# Patient Record
Sex: Male | Born: 1945 | Race: White | Hispanic: No | Marital: Married | Smoking: Never smoker
Health system: Southern US, Community
[De-identification: ages and names within clinical notes are randomized; demographics above are authoritative.]

## PROBLEM LIST (undated history)

## (undated) DIAGNOSIS — M1990 Unspecified osteoarthritis, unspecified site: Secondary | ICD-10-CM

## (undated) DIAGNOSIS — C61 Malignant neoplasm of prostate: Secondary | ICD-10-CM

## (undated) DIAGNOSIS — I10 Essential (primary) hypertension: Secondary | ICD-10-CM

## (undated) HISTORY — DX: Malignant neoplasm of prostate: C61

## (undated) HISTORY — PX: PROSTATE BIOPSY: SHX241

---

## 2004-09-25 HISTORY — PX: CHOLECYSTECTOMY: SHX55

## 2006-09-25 HISTORY — PX: JOINT REPLACEMENT: SHX530

## 2006-12-21 ENCOUNTER — Ambulatory Visit (HOSPITAL_COMMUNITY): Admission: RE | Admit: 2006-12-21 | Discharge: 2006-12-23 | Payer: Self-pay | Admitting: Orthopaedic Surgery

## 2011-10-03 ENCOUNTER — Other Ambulatory Visit (HOSPITAL_COMMUNITY): Payer: Self-pay | Admitting: Orthopaedic Surgery

## 2011-10-04 ENCOUNTER — Encounter (HOSPITAL_COMMUNITY): Payer: Self-pay

## 2011-10-10 ENCOUNTER — Encounter (HOSPITAL_COMMUNITY): Payer: Self-pay

## 2011-10-10 ENCOUNTER — Encounter (HOSPITAL_COMMUNITY)
Admission: RE | Admit: 2011-10-10 | Discharge: 2011-10-10 | Disposition: A | Discharge status: Home / Self Care | Payer: BC Managed Care – PPO | Source: Ambulatory Visit | Attending: Orthopaedic Surgery | Admitting: Orthopaedic Surgery

## 2011-10-10 ENCOUNTER — Other Ambulatory Visit: Payer: Self-pay

## 2011-10-10 ENCOUNTER — Ambulatory Visit (HOSPITAL_COMMUNITY)
Admission: RE | Admit: 2011-10-10 | Discharge: 2011-10-10 | Disposition: A | Discharge status: Home / Self Care | Payer: BC Managed Care – PPO | Source: Ambulatory Visit | Attending: Orthopaedic Surgery | Admitting: Orthopaedic Surgery

## 2011-10-10 DIAGNOSIS — Z01810 Encounter for preprocedural cardiovascular examination: Secondary | ICD-10-CM | POA: Insufficient documentation

## 2011-10-10 DIAGNOSIS — Z01818 Encounter for other preprocedural examination: Secondary | ICD-10-CM | POA: Insufficient documentation

## 2011-10-10 DIAGNOSIS — Z01812 Encounter for preprocedural laboratory examination: Secondary | ICD-10-CM | POA: Insufficient documentation

## 2011-10-10 HISTORY — DX: Essential (primary) hypertension: I10

## 2011-10-10 HISTORY — DX: Unspecified osteoarthritis, unspecified site: M19.90

## 2011-10-10 LAB — BASIC METABOLIC PANEL
BUN: 20 mg/dL (ref 6–23)
CO2: 28 mEq/L (ref 19–32)
Calcium: 9.4 mg/dL (ref 8.4–10.5)
Chloride: 100 mEq/L (ref 96–112)
Creatinine, Ser: 1.03 mg/dL (ref 0.50–1.35)
GFR calc Af Amer: 87 mL/min — ABNORMAL LOW (ref >90)
GFR calc non Af Amer: 75 mL/min — ABNORMAL LOW (ref >90)
Glucose, Bld: 93 mg/dL (ref 70–99)
Potassium: 4.6 mEq/L (ref 3.5–5.1)
Sodium: 138 mEq/L (ref 135–145)

## 2011-10-10 LAB — SURGICAL PCR SCREEN: Staphylococcus aureus: NEGATIVE

## 2011-10-10 LAB — CBC
MCH: 27.1 pg (ref 26.0–34.0)
Platelets: 186 10*3/uL (ref 150–400)
RBC: 4.43 MIL/uL (ref 4.22–5.81)
RDW: 13.8 % (ref 11.5–15.5)
WBC: 4.5 10*3/uL (ref 4.0–10.5)

## 2011-10-10 NOTE — Patient Instructions (Signed)
20 [name]  10/10/2011   Your procedure is scheduled on:  Friday  1/18  AT 1:30 PM  Report to [facility] at 11:30 AM.  Call this number if you have problems the morning of surgery: [phone]   Remember:   Do not eat food AFTER MIDNIGHT THE NIGHT BEFORE YOUR SURGERY.  May have clear liquids FROM MIDNIGHT UNTIL  7:30 AM DAY OF YOUR SURGERY.  Clear liquids include soda, tea, black coffee, apple or grape juice, broth.         NOTHING TO DRING AFTER 7:30 AM THE DAY OF SURGERY.  Take these medicines the morning of surgery with A SIP OF WATER: NO MEDICINES TO TAKE   Do not wear jewelry, make-up or nail polish.  Do not wear lotions, powders, or perfumes. You may wear deodorant.  Do not shave 48 hours prior to surgery.  Do not bring valuables to the hospital.  Contacts, dentures or bridgework may not be worn into surgery.  Leave suitcase in the car. After surgery it may be brought to your room.  For patients admitted to the hospital, checkout time is 11:00 AM the day of discharge.   Patients discharged the day of surgery will not be allowed to drive home.  Name and phone number of your driver: WIFE  Special Instructions: CHG Shower Use Special Wash: 1/2 bottle night before surgery and 1/2 bottle morning of surgery.   Please read over the following fact sheets that you were given: MRSA Information

## 2011-10-10 NOTE — Pre-Procedure Instructions (Signed)
CXR AND EKG WERE DONE TODAY PREOP  -AT [facility] - AS PER ANESTHESIOLGIST'S GUIDELINES

## 2011-10-13 ENCOUNTER — Encounter (HOSPITAL_COMMUNITY): Payer: Self-pay | Admitting: *Deleted

## 2011-10-13 ENCOUNTER — Ambulatory Visit (HOSPITAL_COMMUNITY)
Admission: RE | Admit: 2011-10-13 | Discharge: 2011-10-13 | Disposition: A | Discharge status: Home / Self Care | Payer: BC Managed Care – PPO | Source: Ambulatory Visit | Attending: Orthopaedic Surgery | Admitting: Orthopaedic Surgery

## 2011-10-13 ENCOUNTER — Encounter (HOSPITAL_COMMUNITY)
Admission: RE | Disposition: A | Discharge status: Home / Self Care | Payer: Self-pay | Source: Ambulatory Visit | Attending: Orthopaedic Surgery

## 2011-10-13 ENCOUNTER — Encounter (HOSPITAL_COMMUNITY): Payer: Self-pay | Admitting: Anesthesiology

## 2011-10-13 ENCOUNTER — Ambulatory Visit (HOSPITAL_COMMUNITY): Payer: BC Managed Care – PPO | Admitting: Anesthesiology

## 2011-10-13 DIAGNOSIS — M7540 Impingement syndrome of unspecified shoulder: Secondary | ICD-10-CM

## 2011-10-13 DIAGNOSIS — M67919 Unspecified disorder of synovium and tendon, unspecified shoulder: Secondary | ICD-10-CM | POA: Insufficient documentation

## 2011-10-13 DIAGNOSIS — Z96649 Presence of unspecified artificial hip joint: Secondary | ICD-10-CM | POA: Insufficient documentation

## 2011-10-13 DIAGNOSIS — Z79899 Other long term (current) drug therapy: Secondary | ICD-10-CM | POA: Insufficient documentation

## 2011-10-13 DIAGNOSIS — I10 Essential (primary) hypertension: Secondary | ICD-10-CM | POA: Insufficient documentation

## 2011-10-13 DIAGNOSIS — Z7982 Long term (current) use of aspirin: Secondary | ICD-10-CM | POA: Insufficient documentation

## 2011-10-13 DIAGNOSIS — M19019 Primary osteoarthritis, unspecified shoulder: Secondary | ICD-10-CM | POA: Insufficient documentation

## 2011-10-13 DIAGNOSIS — M719 Bursopathy, unspecified: Secondary | ICD-10-CM | POA: Insufficient documentation

## 2011-10-13 DIAGNOSIS — M25819 Other specified joint disorders, unspecified shoulder: Secondary | ICD-10-CM | POA: Insufficient documentation

## 2011-10-13 SURGERY — SHOULDER ARTHROSCOPY WITH SUBACROMIAL DECOMPRESSION
Anesthesia: General | Site: Shoulder | Laterality: Right | Wound class: Clean

## 2011-10-13 MED ORDER — EPHEDRINE SULFATE 50 MG/ML IJ SOLN
INTRAMUSCULAR | Status: DC | PRN
  Administered 2011-10-13 (×3): 5 mg via INTRAVENOUS

## 2011-10-13 MED ORDER — BUPIVACAINE-EPINEPHRINE 0.25% -1:200000 IJ SOLN
INTRAMUSCULAR | Status: AC
  Filled 2011-10-13: qty 1

## 2011-10-13 MED ORDER — EPINEPHRINE HCL 1 MG/ML IJ SOLN
INTRAMUSCULAR | Status: AC
  Filled 2011-10-13: qty 2

## 2011-10-13 MED ORDER — FENTANYL CITRATE 0.05 MG/ML IJ SOLN
INTRAMUSCULAR | Status: AC
  Filled 2011-10-13: qty 2

## 2011-10-13 MED ORDER — CEFAZOLIN SODIUM-DEXTROSE 2-3 GM-% IV SOLR
INTRAVENOUS | Status: AC
  Filled 2011-10-13: qty 50

## 2011-10-13 MED ORDER — ROPIVACAINE HCL 5 MG/ML IJ SOLN
INTRAMUSCULAR | Status: AC
  Filled 2011-10-13: qty 30

## 2011-10-13 MED ORDER — CEFAZOLIN SODIUM-DEXTROSE 2-3 GM-% IV SOLR
2.0000 g | INTRAVENOUS | Status: AC
  Administered 2011-10-13: 2 g via INTRAVENOUS

## 2011-10-13 MED ORDER — SUCCINYLCHOLINE CHLORIDE 20 MG/ML IJ SOLN
INTRAMUSCULAR | Status: DC | PRN
  Administered 2011-10-13: 200 mg via INTRAVENOUS

## 2011-10-13 MED ORDER — AMLODIPINE BESYLATE 5 MG PO TABS
5.0000 mg | ORAL_TABLET | Freq: Once | ORAL | Status: AC
  Administered 2011-10-13: 5 mg via ORAL
  Filled 2011-10-13: qty 1

## 2011-10-13 MED ORDER — LACTATED RINGERS IV SOLN
INTRAVENOUS | Status: DC
  Administered 2011-10-13: 1000 mL via INTRAVENOUS

## 2011-10-13 MED ORDER — FENTANYL CITRATE 0.05 MG/ML IJ SOLN
INTRAMUSCULAR | Status: DC | PRN
  Administered 2011-10-13 (×3): 50 ug via INTRAVENOUS

## 2011-10-13 MED ORDER — DEXAMETHASONE SODIUM PHOSPHATE 10 MG/ML IJ SOLN
INTRAMUSCULAR | Status: DC | PRN
  Administered 2011-10-13: 10 mg via INTRAVENOUS

## 2011-10-13 MED ORDER — ONDANSETRON HCL 4 MG/2ML IJ SOLN
INTRAMUSCULAR | Status: DC | PRN
  Administered 2011-10-13: 4 mg via INTRAVENOUS

## 2011-10-13 MED ORDER — PROPOFOL 10 MG/ML IV EMUL
INTRAVENOUS | Status: DC | PRN
  Administered 2011-10-13: 200 mg via INTRAVENOUS

## 2011-10-13 MED ORDER — FENTANYL CITRATE 0.05 MG/ML IJ SOLN
50.0000 ug | INTRAMUSCULAR | Status: DC | PRN
  Administered 2011-10-13: 100 ug via INTRAVENOUS

## 2011-10-13 MED ORDER — PROMETHAZINE HCL 25 MG/ML IJ SOLN
INTRAMUSCULAR | Status: AC
  Administered 2011-10-13: 6.25 mg via INTRAVENOUS
  Filled 2011-10-13: qty 1

## 2011-10-13 MED ORDER — PHENYLEPHRINE HCL 10 MG/ML IJ SOLN
10.0000 mg | INTRAVENOUS | Status: DC | PRN
  Administered 2011-10-13: 20 ug/min via INTRAVENOUS

## 2011-10-13 MED ORDER — OXYCODONE-ACETAMINOPHEN 5-325 MG PO TABS: 1.0000 | ORAL_TABLET | ORAL | Status: AC | PRN

## 2011-10-13 MED ORDER — LIDOCAINE HCL (CARDIAC) 20 MG/ML IV SOLN
INTRAVENOUS | Status: DC | PRN
  Administered 2011-10-13: 100 mg via INTRAVENOUS

## 2011-10-13 MED ORDER — ROPIVACAINE HCL 5 MG/ML IJ SOLN
INTRAMUSCULAR | Status: DC | PRN
  Administered 2011-10-13: 30 mL

## 2011-10-13 MED ORDER — LACTATED RINGERS IR SOLN
Status: DC | PRN
  Administered 2011-10-13: 3000 mL

## 2011-10-13 MED ORDER — ACETAMINOPHEN 10 MG/ML IV SOLN
INTRAVENOUS | Status: AC
  Filled 2011-10-13: qty 100

## 2011-10-13 MED ORDER — MIDAZOLAM HCL 2 MG/2ML IJ SOLN
INTRAMUSCULAR | Status: AC
  Filled 2011-10-13: qty 2

## 2011-10-13 MED ORDER — MIDAZOLAM HCL 5 MG/5ML IJ SOLN
INTRAMUSCULAR | Status: DC | PRN
  Administered 2011-10-13: 2 mg via INTRAVENOUS

## 2011-10-13 MED ORDER — HYDROMORPHONE HCL PF 1 MG/ML IJ SOLN: 0.2500 mg | INTRAMUSCULAR | Status: DC | PRN

## 2011-10-13 MED ORDER — PROMETHAZINE HCL 25 MG/ML IJ SOLN
6.2500 mg | INTRAMUSCULAR | Status: DC | PRN
  Administered 2011-10-13: 6.25 mg via INTRAVENOUS

## 2011-10-13 MED ORDER — ACETAMINOPHEN 10 MG/ML IV SOLN
INTRAVENOUS | Status: DC | PRN
  Administered 2011-10-13: 1000 mg via INTRAVENOUS

## 2011-10-13 MED ORDER — EPINEPHRINE HCL 1 MG/ML IJ SOLN
INTRAMUSCULAR | Status: DC | PRN
  Administered 2011-10-13: 2 mg

## 2011-10-13 MED ORDER — MIDAZOLAM HCL 2 MG/2ML IJ SOLN
1.0000 mg | INTRAMUSCULAR | Status: DC | PRN
  Administered 2011-10-13: 2 mg via INTRAVENOUS

## 2011-10-13 SURGICAL SUPPLY — 46 items
ANCHOR PUSHLOCK PEEK 3.5X19.5 (Anchor) ×2 IMPLANT
BLADE 4.2CUDA (BLADE) ×2 IMPLANT
BLADE SURG SZ11 CARB STEEL (BLADE) ×2 IMPLANT
BOOTIES KNEE HIGH SLOAN (MISCELLANEOUS) IMPLANT
BUR VERTEX HOODED 4.5 (BURR) ×2 IMPLANT
CANNULA 5.75X7 CRYSTAL CLEAR (CANNULA) ×2 IMPLANT
CANNULA SHOULDER 7CM (CANNULA) ×2 IMPLANT
CANNULA TWIST IN 8.25X7CM (CANNULA) IMPLANT
CLOTH BEACON ORANGE TIMEOUT ST (SAFETY) ×2 IMPLANT
DRAPE INCISE IOBAN 66X45 STRL (DRAPES) ×2 IMPLANT
DRAPE SHOULDER BEACH CHAIR (DRAPES) ×2 IMPLANT
DRAPE U-SHAPE 47X51 STRL (DRAPES) ×2 IMPLANT
DRSG PAD ABDOMINAL 8X10 ST (GAUZE/BANDAGES/DRESSINGS) ×2 IMPLANT
DURAPREP 26ML APPLICATOR (WOUND CARE) ×2 IMPLANT
GAUZE XEROFORM 1X8 LF (GAUZE/BANDAGES/DRESSINGS) ×2 IMPLANT
GLOVE BIO SURGEON STRL SZ7.5 (GLOVE) ×2 IMPLANT
GLOVE BIO SURGEON STRL SZ8 (GLOVE) ×4 IMPLANT
GLOVE BIOGEL PI IND STRL 8 (GLOVE) ×1 IMPLANT
GLOVE BIOGEL PI INDICATOR 8 (GLOVE) ×1
GLOVE ECLIPSE 8.0 STRL XLNG CF (GLOVE) ×2 IMPLANT
GLOVE ORTHO TXT STRL SZ7.5 (GLOVE) ×2 IMPLANT
GOWN PREVENTION PLUS LG XLONG (DISPOSABLE) ×2 IMPLANT
GOWN PREVENTION PLUS XLARGE (GOWN DISPOSABLE) ×2 IMPLANT
GOWN STRL NON-REIN LRG LVL3 (GOWN DISPOSABLE) ×2 IMPLANT
GOWN STRL REIN XL XLG (GOWN DISPOSABLE) ×4 IMPLANT
IV LACTATED RINGER IRRG 3000ML (IV SOLUTION) ×3
IV LR IRRIG 3000ML ARTHROMATIC (IV SOLUTION) ×3 IMPLANT
MANIFOLD NEPTUNE II (INSTRUMENTS) ×2 IMPLANT
NEEDLE HYPO 25X1 1.5 SAFETY (NEEDLE) IMPLANT
NEEDLE SCORPION (NEEDLE) ×2 IMPLANT
NEEDLE SPNL 18GX3.5 QUINCKE PK (NEEDLE) ×2 IMPLANT
PACK SHOULDER CUSTOM OPM052 (CUSTOM PROCEDURE TRAY) ×2 IMPLANT
POSITIONER SURGICAL ARM (MISCELLANEOUS) ×2 IMPLANT
SET ARTHROSCOPY TUBING (MISCELLANEOUS) ×1
SET ARTHROSCOPY TUBING LN (MISCELLANEOUS) ×1 IMPLANT
SLING ARM IMMOBILIZER LRG (SOFTGOODS) IMPLANT
SLING ARM IMMOBILIZER XL (CAST SUPPLIES) ×2 IMPLANT
SPONGE GAUZE 4X4 12PLY (GAUZE/BANDAGES/DRESSINGS) ×2 IMPLANT
STAPLER VISISTAT 35W (STAPLE) IMPLANT
STRIP CLOSURE SKIN 1/2X4 (GAUZE/BANDAGES/DRESSINGS) IMPLANT
SUT ETHILON 3 0 PS 1 (SUTURE) ×2 IMPLANT
SYR CONTROL 10ML LL (SYRINGE) IMPLANT
TAPE HYPAFIX 4 X10 (GAUZE/BANDAGES/DRESSINGS) ×2 IMPLANT
TOWEL OR 17X26 10 PK STRL BLUE (TOWEL DISPOSABLE) ×2 IMPLANT
TUBING CONNECTING 10 (TUBING) ×2 IMPLANT
WAND 90 DEG TURBOVAC W/CORD (SURGICAL WAND) ×2 IMPLANT

## 2011-10-13 NOTE — Anesthesia Preprocedure Evaluation (Signed)
Anesthesia Evaluation  Patient identified by MRN, date of birth, ID band Patient awake    Reviewed: Allergy & Precautions, H&P , NPO status , Patient's Chart, lab work & pertinent test results  Airway Mallampati: II TM Distance: <3 FB Neck ROM: Full    Dental No notable dental hx.    Pulmonary neg pulmonary ROS,  clear to auscultation  Pulmonary exam normal       Cardiovascular hypertension, Regular Normal    Neuro/Psych Negative Neurological ROS  Negative Psych ROS   GI/Hepatic negative GI ROS, Neg liver ROS,   Endo/Other  Negative Endocrine ROSMorbid obesity  Renal/GU negative Renal ROS  Genitourinary negative   Musculoskeletal negative musculoskeletal ROS (+)   Abdominal   Peds negative pediatric ROS (+)  Hematology negative hematology ROS (+)   Anesthesia Other Findings   Reproductive/Obstetrics negative OB ROS                           Anesthesia Physical Anesthesia Plan  ASA: III  Anesthesia Plan:    Post-op Pain Management:    Induction: Intravenous  Airway Management Planned: Oral ETT  Additional Equipment:   Intra-op Plan:   Post-operative Plan: Extubation in OR  Informed Consent: I have reviewed the patients History and Physical, chart, labs and discussed the procedure including the risks, benefits and alternatives for the proposed anesthesia with the patient or authorized representative who has indicated his/her understanding and acceptance.   Dental advisory given  Plan Discussed with: CRNA  Anesthesia Plan Comments:         Anesthesia Quick Evaluation

## 2011-10-13 NOTE — Progress Notes (Signed)
Block postponed after sedation medication given due to case in OR room was going to take longer than expected.  Dr Dot Lanes, MD postponed block until closer to surgery time.

## 2011-10-13 NOTE — Anesthesia Procedure Notes (Signed)
Anesthesia Regional Block:  Interscalene brachial plexus block  Pre-Anesthetic Checklist: ,, timeout performed, Correct Patient, Correct Site, Correct Laterality, Correct Procedure, Correct Position, site marked, Risks and benefits discussed,  Surgical consent,  Pre-op evaluation,  At surgeon's request and post-op pain management  Laterality: Right  Prep: chloraprep       Needles:  Injection technique: Single-shot  Needle Type: Stimiplex     Needle Length:cm 9 cm Needle Gauge: 21 G    Additional Needles:  Procedures: ultrasound guided Interscalene brachial plexus block Narrative:  Start time: 10/13/2011 2:45 PM End time: 10/13/2011 3:00 PM Injection made incrementally with aspirations every 5 mL.  Performed by: Personally  Anesthesiologist: Eilene Ghazi MD  Additional Notes: Patient tolerated the procedure well without complications  Interscalene brachial plexus block

## 2011-10-13 NOTE — Progress Notes (Signed)
Notified pt of delay

## 2011-10-13 NOTE — H&P (Signed)
[name] is an 66 y.o. male.   Chief Complaint:   Severe right shoulder pain HPI:   Know right shoulder impingement and arthritis.  Failed conservative treatment.  Wishes to proceed with a shoulder scope given the failure of conservative treatment.  Past Medical History  Diagnosis Date  . Hypertension   . 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    Past Surgical History  Procedure Date  . Cholecystectomy 2006  . Joint replacement 2008    right hip arthroplasty    History reviewed. No pertinent family history. Social History:  reports that he has never smoked. He has never used smokeless tobacco. He reports that he does not drink alcohol or use illicit drugs.  Allergies: No Known Allergies  Medications Prior to Admission  Medication Dose Route Frequency Provider Last Rate Last Dose  . amLODipine (NORVASC) tablet 5 mg  5 mg Oral Once Kathryne Hitch, MD   5 mg at 10/13/11 1610  . ceFAZolin (ANCEF) IVPB 2 g/50 mL premix  2 g Intravenous 60 min Pre-Op Kathryne Hitch, MD       Medications Prior to Admission  Medication Sig Dispense Refill  . amLODipine-valsartan (EXFORGE) 5-160 MG per tablet Take 1 tablet by mouth daily before breakfast.      . aspirin EC 81 MG tablet Take 81 mg by mouth daily before breakfast.      . B Complex Vitamins (B COMPLEX PO) Take 1 capsule by mouth daily.      Marland Kitchen LYSINE PO Take 1 tablet by mouth daily before breakfast.      . Multiple Vitamin (MULITIVITAMIN WITH MINERALS) TABS Take 1 tablet by mouth daily.        No results found for this or any previous visit (from the past 48 hour(s)). No results found.  Review of Systems  All other systems reviewed and are negative.    Blood pressure 138/84, pulse 95, temperature 97.8 F (36.6 C), temperature source Oral, resp. rate 18, SpO2 97.00%. Physical Exam  Constitutional: He is oriented to person, place, and time. He appears well-developed and well-nourished.  HENT:  Head:  Normocephalic and atraumatic.  Eyes: EOM are normal. Pupils are equal, round, and reactive to light.  Neck: Normal range of motion. Neck supple.  Cardiovascular: Normal rate and regular rhythm.   Respiratory: Effort normal and breath sounds normal.  GI: Soft. Bowel sounds are normal.  Musculoskeletal:       Right shoulder: He exhibits decreased range of motion and bony tenderness.  Neurological: He is alert and oriented to person, place, and time.  Skin: Skin is warm and dry.  Psychiatric: He has a normal mood and affect.     Assessment/Plan To the OR today for a right shoulder arthroscopy  Bhavin Monjaraz Y 10/13/2011, 12:21 PM

## 2011-10-13 NOTE — Anesthesia Postprocedure Evaluation (Signed)
  Anesthesia Post-op Note  Patient: [name]  Procedure(s) Performed:  SHOULDER ARTHROSCOPY WITH SUBACROMIAL DECOMPRESSION - with Debridement  Patient Location: PACU  Anesthesia Type: GA combined with regional for post-op pain  Level of Consciousness: awake and alert   Airway and Oxygen Therapy: Patient Spontanous Breathing  Post-op Pain: mild  Post-op Assessment: Post-op Vital signs reviewed, Patient's Cardiovascular Status Stable, Respiratory Function Stable, Patent Airway and No signs of Nausea or vomiting  Post-op Vital Signs: stable  Complications: No apparent anesthesia complications

## 2011-10-13 NOTE — Brief Op Note (Signed)
10/13/2011  5:21 PM  PATIENT:  [name]  66 y.o. male  PRE-OPERATIVE DIAGNOSIS:  Right Shoulder Impingment Syndrome  POST-OPERATIVE DIAGNOSIS:  Right Shoulder Impingment Syndrome  PROCEDURE:  Procedure(s): SHOULDER ARTHROSCOPY WITH SUBACROMIAL DECOMPRESSION AND EXTENSIVE DEBRIDEMENT  SURGEON:  Surgeon(s): Kathryne Hitch, MD  PHYSICIAN ASSISTANT:   ASSISTANTS: none   ANESTHESIA:   regional and general  EBL:  Total I/O In: 1000 [I.V.:1000] Out: -   BLOOD ADMINISTERED:none  DRAINS: none   LOCAL MEDICATIONS USED:  NONE  SPECIMEN:  No Specimen  DISPOSITION OF SPECIMEN:  N/A  COUNTS:  YES  TOURNIQUET:  * No tourniquets in log *  DICTATION: .Other Dictation: Dictation Number [id number]  PLAN OF CARE: Discharge to home after PACU  PATIENT DISPOSITION:  PACU - hemodynamically stable.   Delay start of Pharmacological VTE agent (>24hrs) due to surgical blood loss or risk of bleeding:  {YES/NO/NOT APPLICABLE:20182

## 2011-10-13 NOTE — Transfer of Care (Signed)
Immediate Anesthesia Transfer of Care Note  Patient: [name]  Procedure(s) Performed:  SHOULDER ARTHROSCOPY WITH SUBACROMIAL DECOMPRESSION - with Debridement  Patient Location: PACU  Anesthesia Type: General  Level of Consciousness: sedated, patient cooperative and responds to stimulaton  Airway & Oxygen Therapy: Patient Spontanous Breathing and Patient connected to face mask oxgen  Post-op Assessment: Report given to PACU RN and Post -op Vital signs reviewed and stable  Post vital signs: Reviewed and stable  Complications: No apparent anesthesia complications

## 2011-10-14 NOTE — Op Note (Signed)
NAMEKIRON, [name] NO.:  [id number]  MEDICAL RECORD NO.:  [id number]  LOCATION:  [facility]                         FACILITY:  [facility]  PHYSICIAN:  Vanita Panda. 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  PROCEDURE:  Right shoulder arthroscopy with extensive debridement and subacromial decompression.  SURGEON:  Vanita Panda. Magnus Ivan, M.D.  ANESTHESIA: 1. Regional right shoulder block. 2. General.  BLOOD LOSS:  Minimal.  COMPLICATIONS:  None.  INDICATIONS:  [name] is a 66 year old gentleman well known to me. He has had shoulder problems for some time with his right shoulder and x- ray evidence of significant osteoarthritis of the shoulder.  He does not wish to proceed with a shoulder replacement, would like at least an arthroscopic intervention, so we could clean up the shoulder soft tissues and get him at least some decrease in pain and improvement in function.  The risks and benefits of surgery explained to him in detail, and he does wished to proceed with surgery.  PROCEDURE DESCRIPTION:  After informed consent was obtained, appropriate right shoulder was marked.  Anesthesia obtained with regional shoulder block.  Then, he was brought to the operating room and placed supine on the operating table on a bean bag.  Once general anesthesia was obtained, he was turned into the lateral decubitus position with appropriate padding around his body.  An axillary roll was placed as well, and then his right arm was prepped and draped with DuraPrep and sterile dressings placed in.  45 degrees of forward flexion and abduction with 15 pounds of traction due to his  size.  A time-out was called and he was identified as correct patient and correct right shoulder.  I then made a posterior arthroscopy portal at the posterolateral subacromion and entered the glenohumeral joint.  I found extensive arthritic changes along the humeral head and the glenoid. There were synovium that was inflamed throughout the shoulder.  The biceps tendon was intact and there was some partial tearing of the undersurface of the rotator cuff.  I made an anterior portal in the rotator interval between the subscapularis and the biceps tendon.  I found the subscapularis to be intact.  Using a soft tissue ablation wand and  arthroscopic shaver that was performed in extensive debridement. The lining of the shoulder cartilage that was frayed from the humeral head, the undersurface of the rotator cuff, and all around the labrum of the shoulder, it was quite extensive.  I then do to the posterior portal in the subacromial space, and made a separate lateral portal as well.  I found significant bursitis in the subacromial space and significant tendinosis of the rotator cuff.  I used soft tissue ablation wand and shaver to perform subacromial decompression, and cleaned the rotator cuff tendon off debride, tendinosis, and bursa.  I then used a high- speed bur to coplane underneath the clavicle and the acromion, and removed some sharp  bone off from front of the acromion with a partial acromioplasty.  I then removed all instrumentation and closed with 3 portal sites with nylon suture.  Xeroform followed by well-padded sterile dressing was applied, and the patient was placed in a sling, awakened, extubated, and taken to recovery room in stable condition. All final counts were correct.  There were no complications noted.  He will be discharged to home from the recovery room after short stay.     Vanita Panda. Magnus Ivan, M.D.     CYB/MEDQ  D:  10/13/2011  T:  10/14/2011  Job:  [id number]

## 2012-12-30 ENCOUNTER — Other Ambulatory Visit: Payer: Self-pay | Admitting: Gastroenterology

## 2013-01-09 ENCOUNTER — Encounter (HOSPITAL_COMMUNITY)
Admission: RE | Disposition: A | Discharge status: Home / Self Care | Payer: Self-pay | Source: Ambulatory Visit | Attending: Gastroenterology

## 2013-01-09 ENCOUNTER — Ambulatory Visit (HOSPITAL_COMMUNITY)
Admission: RE | Admit: 2013-01-09 | Discharge: 2013-01-09 | Disposition: A | Discharge status: Home / Self Care | Payer: Medicare Other | Source: Ambulatory Visit | Attending: Gastroenterology | Admitting: Gastroenterology

## 2013-01-09 ENCOUNTER — Encounter (HOSPITAL_COMMUNITY): Payer: Self-pay | Admitting: *Deleted

## 2013-01-09 DIAGNOSIS — Z860100 Personal history of colon polyps, unspecified: Secondary | ICD-10-CM | POA: Insufficient documentation

## 2013-01-09 DIAGNOSIS — Z1211 Encounter for screening for malignant neoplasm of colon: Secondary | ICD-10-CM | POA: Insufficient documentation

## 2013-01-09 DIAGNOSIS — I8390 Asymptomatic varicose veins of unspecified lower extremity: Secondary | ICD-10-CM | POA: Insufficient documentation

## 2013-01-09 DIAGNOSIS — K5730 Diverticulosis of large intestine without perforation or abscess without bleeding: Secondary | ICD-10-CM | POA: Insufficient documentation

## 2013-01-09 DIAGNOSIS — Z79899 Other long term (current) drug therapy: Secondary | ICD-10-CM | POA: Insufficient documentation

## 2013-01-09 DIAGNOSIS — I10 Essential (primary) hypertension: Secondary | ICD-10-CM | POA: Insufficient documentation

## 2013-01-09 HISTORY — PX: COLONOSCOPY: SHX5424

## 2013-01-09 SURGERY — COLONOSCOPY
Anesthesia: Moderate Sedation

## 2013-01-09 MED ORDER — MIDAZOLAM HCL 10 MG/2ML IJ SOLN
INTRAMUSCULAR | Status: AC
  Filled 2013-01-09: qty 4

## 2013-01-09 MED ORDER — FENTANYL CITRATE 0.05 MG/ML IJ SOLN
INTRAMUSCULAR | Status: DC | PRN
  Administered 2013-01-09 (×4): 25 ug via INTRAVENOUS

## 2013-01-09 MED ORDER — FENTANYL CITRATE 0.05 MG/ML IJ SOLN
INTRAMUSCULAR | Status: AC
  Filled 2013-01-09: qty 4

## 2013-01-09 MED ORDER — DIPHENHYDRAMINE HCL 50 MG/ML IJ SOLN
INTRAMUSCULAR | Status: AC
  Filled 2013-01-09: qty 1

## 2013-01-09 MED ORDER — MIDAZOLAM HCL 5 MG/5ML IJ SOLN
INTRAMUSCULAR | Status: DC | PRN
  Administered 2013-01-09: 2 mg via INTRAVENOUS
  Administered 2013-01-09: 1 mg via INTRAVENOUS
  Administered 2013-01-09: 2 mg via INTRAVENOUS
  Administered 2013-01-09 (×2): 2.5 mg via INTRAVENOUS

## 2013-01-09 MED ORDER — LACTATED RINGERS IV SOLN
INTRAVENOUS | Status: DC
  Administered 2013-01-09: 1000 mL via INTRAVENOUS

## 2013-01-09 MED ORDER — SODIUM CHLORIDE 0.9 % IV SOLN: INTRAVENOUS | Status: DC

## 2013-01-09 NOTE — Op Note (Signed)
Procedure: Surveillance colonoscopy  Endoscopist: Danise Edge  Premedication: Fentanyl 100 mcg. Versed 10 mg. Patient remained alert throughout the procedure despite receiving conscious sedation. I recommend using propofol with the patient's next colonoscopy.  Procedure: The patient was placed in the left lateral decubitus position. Anal inspection and digital rectal exam were normal. The Pentax pediatric colonoscope was introduced into the rectum and advanced to the cecum. A normal-appearing ileocecal valve and appendiceal orifice were identified. Colonic preparation for the exam today was fair. With vigorous colonic irrigation, the colon mucosa was adequately visualized to rule out polyps.  Rectum. Normal. Retroflex view of the distal rectum  Sigmoid colon and descending colon. Left colonic diverticulosis  Splenic flexure. Normal.  Transverse colon. Normal.  Hepatic flexure. Normal.  Ascending colon. Normal.  Cecum and ileocecal valve. Normal.  Assessment:  #1. Left colonic diverticulosis  #2. Normal screening proctocolonoscopy to the cecum. Colon preparation for the exam today was graded as been fair  Recommendations: Schedule surveillance colonoscopy in 5 years. Use propofol sedation with the next colonoscopy.

## 2013-01-09 NOTE — H&P (Signed)
  Procedure: Surveillance colonoscopy.  History: The patient is a 67 year old male born [birth year]. The patient tells me he has undergone colonoscopic exams in the past to remove colon polyps.  The patient is scheduled to undergo a surveillance colonoscopy today.  Medication allergies: None  Chronic medications: Exforge. Hydrochlorothiazide.  Past medical and surgical history: Hypertension. Lower extremity varicose veins. Laparoscopic cholecystectomy. Achilles tendon reattachment.  Habits: The patient has never smoked cigarettes.  Exam: The patient is alert and lying comfortably on the endoscopy stretcher. Lungs are clear to auscultation. Cardiac exam reveals a regular rhythm. Mouth and throat appear normal.  Plan: Proceed with surveillance colonoscopy.

## 2013-01-10 ENCOUNTER — Encounter (HOSPITAL_COMMUNITY): Payer: Self-pay | Admitting: Gastroenterology

## 2015-01-20 ENCOUNTER — Emergency Department (HOSPITAL_COMMUNITY): Payer: Commercial Managed Care - HMO

## 2015-01-20 ENCOUNTER — Encounter (HOSPITAL_COMMUNITY): Payer: Self-pay | Admitting: *Deleted

## 2015-01-20 ENCOUNTER — Inpatient Hospital Stay (HOSPITAL_COMMUNITY)
Admission: EM | Admit: 2015-01-20 | Discharge: 2015-01-22 | DRG: 872 | Disposition: A | Discharge status: Home / Self Care | Payer: Commercial Managed Care - HMO | Attending: Urology | Admitting: Urology

## 2015-01-20 DIAGNOSIS — I10 Essential (primary) hypertension: Secondary | ICD-10-CM | POA: Diagnosis present

## 2015-01-20 DIAGNOSIS — N390 Urinary tract infection, site not specified: Secondary | ICD-10-CM | POA: Diagnosis present

## 2015-01-20 DIAGNOSIS — Z9049 Acquired absence of other specified parts of digestive tract: Secondary | ICD-10-CM | POA: Diagnosis present

## 2015-01-20 DIAGNOSIS — R3915 Urgency of urination: Secondary | ICD-10-CM | POA: Diagnosis present

## 2015-01-20 DIAGNOSIS — R319 Hematuria, unspecified: Secondary | ICD-10-CM | POA: Diagnosis present

## 2015-01-20 DIAGNOSIS — Z9889 Other specified postprocedural states: Secondary | ICD-10-CM

## 2015-01-20 DIAGNOSIS — B9620 Unspecified Escherichia coli [E. coli] as the cause of diseases classified elsewhere: Secondary | ICD-10-CM | POA: Diagnosis present

## 2015-01-20 DIAGNOSIS — A419 Sepsis, unspecified organism: Principal | ICD-10-CM | POA: Diagnosis present

## 2015-01-20 DIAGNOSIS — N179 Acute kidney failure, unspecified: Secondary | ICD-10-CM | POA: Diagnosis not present

## 2015-01-20 DIAGNOSIS — R509 Fever, unspecified: Secondary | ICD-10-CM | POA: Diagnosis present

## 2015-01-20 DIAGNOSIS — M1990 Unspecified osteoarthritis, unspecified site: Secondary | ICD-10-CM | POA: Diagnosis present

## 2015-01-20 DIAGNOSIS — R972 Elevated prostate specific antigen [PSA]: Secondary | ICD-10-CM | POA: Diagnosis present

## 2015-01-20 DIAGNOSIS — Z96641 Presence of right artificial hip joint: Secondary | ICD-10-CM | POA: Diagnosis present

## 2015-01-20 LAB — CBC WITH DIFFERENTIAL/PLATELET
BASOS PCT: 0 % (ref 0–1)
Basophils Absolute: 0 10*3/uL (ref 0.0–0.1)
EOS ABS: 0 10*3/uL (ref 0.0–0.7)
Eosinophils Relative: 0 % (ref 0–5)
HCT: 37.8 % — ABNORMAL LOW (ref 39.0–52.0)
Hemoglobin: 11.9 g/dL — ABNORMAL LOW (ref 13.0–17.0)
Lymphocytes Relative: 2 % — ABNORMAL LOW (ref 12–46)
Lymphs Abs: 0.1 10*3/uL — ABNORMAL LOW (ref 0.7–4.0)
MCH: 28 pg (ref 26.0–34.0)
MCHC: 31.5 g/dL (ref 30.0–36.0)
MCV: 88.9 fL (ref 78.0–100.0)
Monocytes Absolute: 0 10*3/uL — ABNORMAL LOW (ref 0.1–1.0)
Monocytes Relative: 1 % — ABNORMAL LOW (ref 3–12)
NEUTROS ABS: 5.8 10*3/uL (ref 1.7–7.7)
NEUTROS PCT: 97 % — AB (ref 43–77)
PLATELETS: 130 10*3/uL — AB (ref 150–400)
RBC: 4.25 MIL/uL (ref 4.22–5.81)
RDW: 14.4 % (ref 11.5–15.5)
WBC: 6 10*3/uL (ref 4.0–10.5)

## 2015-01-20 LAB — URINALYSIS, ROUTINE W REFLEX MICROSCOPIC
Glucose, UA: NEGATIVE mg/dL
Ketones, ur: 15 mg/dL — AB
NITRITE: POSITIVE — AB
PH: 5.5 (ref 5.0–8.0)
Protein, ur: >300 mg/dL — AB
SPECIFIC GRAVITY, URINE: 1.025 (ref 1.005–1.030)
UROBILINOGEN UA: 1 mg/dL (ref 0.0–1.0)

## 2015-01-20 LAB — URINE MICROSCOPIC-ADD ON

## 2015-01-20 LAB — COMPREHENSIVE METABOLIC PANEL
ALT: 18 U/L (ref 0–53)
AST: 32 U/L (ref 0–37)
Albumin: 3.7 g/dL (ref 3.5–5.2)
Alkaline Phosphatase: 57 U/L (ref 39–117)
Anion gap: 7 (ref 5–15)
BILIRUBIN TOTAL: 1.2 mg/dL (ref 0.3–1.2)
BUN: 33 mg/dL — AB (ref 6–23)
CO2: 23 mmol/L (ref 19–32)
Calcium: 8.6 mg/dL (ref 8.4–10.5)
Chloride: 105 mmol/L (ref 96–112)
Creatinine, Ser: 2.06 mg/dL — ABNORMAL HIGH (ref 0.50–1.35)
GFR calc Af Amer: 37 mL/min — ABNORMAL LOW (ref >90)
GFR, EST NON AFRICAN AMERICAN: 32 mL/min — AB (ref >90)
GLUCOSE: 121 mg/dL — AB (ref 70–99)
POTASSIUM: 4.3 mmol/L (ref 3.5–5.1)
SODIUM: 135 mmol/L (ref 135–145)
TOTAL PROTEIN: 6.8 g/dL (ref 6.0–8.3)

## 2015-01-20 LAB — I-STAT CG4 LACTIC ACID, ED: LACTIC ACID, VENOUS: 3.39 mmol/L — AB (ref 0.5–2.0)

## 2015-01-20 MED ORDER — VANCOMYCIN HCL IN DEXTROSE 1-5 GM/200ML-% IV SOLN
1000.0000 mg | Freq: Once | INTRAVENOUS | Status: AC
  Administered 2015-01-20: 1000 mg via INTRAVENOUS
  Filled 2015-01-20: qty 200

## 2015-01-20 MED ORDER — SODIUM CHLORIDE 0.9 % IV SOLN
1500.0000 mg | INTRAVENOUS | Status: DC
  Administered 2015-01-21: 1500 mg via INTRAVENOUS
  Filled 2015-01-20: qty 1500

## 2015-01-20 MED ORDER — PIPERACILLIN-TAZOBACTAM 3.375 G IVPB
3.3750 g | Freq: Three times a day (TID) | INTRAVENOUS | Status: DC
  Administered 2015-01-21 – 2015-01-22 (×5): 3.375 g via INTRAVENOUS
  Filled 2015-01-20 (×5): qty 50

## 2015-01-20 MED ORDER — ACETAMINOPHEN 500 MG PO TABS
1000.0000 mg | ORAL_TABLET | Freq: Once | ORAL | Status: AC
  Administered 2015-01-20: 1000 mg via ORAL
  Filled 2015-01-20: qty 2

## 2015-01-20 MED ORDER — SODIUM CHLORIDE 0.9 % IV BOLUS (SEPSIS)
500.0000 mL | INTRAVENOUS | Status: AC
  Administered 2015-01-20: 500 mL via INTRAVENOUS

## 2015-01-20 MED ORDER — SODIUM CHLORIDE 0.9 % IV BOLUS (SEPSIS)
1000.0000 mL | INTRAVENOUS | Status: AC
  Administered 2015-01-20 – 2015-01-21 (×4): 1000 mL via INTRAVENOUS

## 2015-01-20 MED ORDER — PIPERACILLIN-TAZOBACTAM 3.375 G IVPB 30 MIN
3.3750 g | Freq: Once | INTRAVENOUS | Status: AC
  Administered 2015-01-20: 3.375 g via INTRAVENOUS
  Filled 2015-01-20: qty 50

## 2015-01-20 MED ORDER — SODIUM CHLORIDE 0.9 % IV SOLN
INTRAVENOUS | Status: DC
  Administered 2015-01-21: via INTRAVENOUS

## 2015-01-20 NOTE — H&P (Signed)
H&P  Chief Complaint: Fevers/chills  History of Present Illness: [name] is a 69 y.o. male who underwent a prostate biopsy two days ago on 4/25 with Dr. Janice Norrie for elevated PSA presents with fevers, chills, rigors and urinary frequency and urgency. He was having bloody urine and stools since the biopsy, but the onset of fevers, chills, and rigors was sudden in onset.   In the ER, sepsis protocol was activated as he is hypotensive and tachycardic. Cultures were obtained and he was started on broad spectrum antibiotics and given a bolus of fluids.  Past Medical History  Diagnosis Date  . Hypertension   . 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    Past Surgical History  Procedure Laterality Date  . Cholecystectomy  2006  . Joint replacement  2008    right hip arthroplasty  . Colonoscopy N/A 01/09/2013    Procedure: COLONOSCOPY;  Surgeon: Garlan Fair, MD;  Location: [facility];  Service: Endoscopy;  Laterality: N/A;    Home Medications:    Medication List    ASK your doctor about these medications        amLODipine-valsartan 5-160 MG per tablet  Commonly known as:  EXFORGE  Take 1 tablet by mouth daily before breakfast.     aspirin EC 81 MG tablet  Take 81 mg by mouth daily before breakfast.     B COMPLEX PO  Take 1 capsule by mouth daily.     ibuprofen 200 MG tablet  Commonly known as:  ADVIL,MOTRIN  Take 600 mg by mouth once.     levofloxacin 500 MG tablet  Commonly known as:  LEVAQUIN  Take 1 tablet by mouth every morning.     LYSINE PO  Take 1 tablet by mouth daily before breakfast.     multivitamin with minerals Tabs tablet  Take 1 tablet by mouth daily.     VITAMIN D PO  Take 250 Units by mouth 3 (three) times a week.        Allergies: No Known Allergies  No family history on file.  Social History:  reports that he has never smoked. He has never used smokeless tobacco. He reports that he does not drink alcohol or use illicit  drugs.  ROS: A complete review of systems was performed.  Positive findings noted in HPI. 12 system review otherwise normal.  Physical Exam:  Vital signs in last 24 hours: Temp:  [98.1 F (36.7 C)-101.7 F (38.7 C)] 98.1 F (36.7 C) (04/27 2226) Pulse Rate:  [110-146] 110 (04/27 2226) Resp:  [16-34] 34 (04/27 2226) BP: (97-102)/(48-60) 97/55 mmHg (04/27 2226) SpO2:  [93 %-95 %] 93 % (04/27 2226) Weight:  [310 lb (140.615 kg)] 310 lb (140.615 kg) (04/27 2154) Constitutional:  Alert and oriented, No acute distress, mildly diaphoretic Cardiovascular: Sinus tachycardia, No JVD Respiratory: Normal respiratory effort, Lungs clear bilaterally GI: Abdomen is soft, nontender, nondistended, no abdominal masses GU: No CVA tenderness Lymphatic: No lymphadenopathy Neurologic: Grossly intact, no focal deficits Psychiatric: Normal mood and affect  Laboratory Data:   Recent Labs  01/20/15 2140  WBC 6.0  HGB 11.9*  HCT 37.8*  PLT 130*     Recent Labs  01/20/15 2140  NA 135  K 4.3  CL 105  GLUCOSE 121*  BUN 33*  CALCIUM 8.6  CREATININE 2.06*    Estimated Creatinine Clearance: 52 mL/min (by C-G formula based on Cr of 2.06).  Radiologic Imaging: Dg Chest [facility] 1 [address]  01/20/2015   CLINICAL DATA:  69 year old male with a history of fever and chills.  EXAM: PORTABLE CHEST - 1 VIEW  COMPARISON:  10/10/2011, 12/17/2006  FINDINGS: Cardiomediastinal silhouette unchanged in size and contour.  Atherosclerotic calcification of the aortic arch.  Apical lordotic positioning.  No confluent airspace disease.  No pleural effusion or pneumothorax.  IMPRESSION: No radiographic evidence of acute cardiopulmonary disease.  Atherosclerosis.  Signed,  Dulcy Fanny. Earleen Newport, DO  [facility]  [facility]   Electronically Signed   By: Corrie Mckusick D.O.   On: 01/20/2015 22:05    Impression/Assessment: 24M with post-prostate biopsy sepsis  Plan: -- Admit to  urology. Stepdown status -- Follow up urine cultures and blood cultures -- Continue vanc/zosyn given diminished renal function -- IVF -- Hold home hypertensive medications

## 2015-01-20 NOTE — ED Notes (Signed)
Pt states that he had a prostate biopsy on Monday; pt states that he has not had any difficulty since then until this afternoon; pt states that since his biopsy he has had mild blood to urine and stool which Dr Janice Norrie advised that it was normal; pt states that this afternoon he began to have "violent" chills, pt reports febrile at home for which he took 600mg  Ibuprofen; pt reports urinary frequency and urgency with only going small amounts and grossly bloody urine this evening; pt flushed, diaphoretic and tachycardic in triage

## 2015-01-20 NOTE — ED Provider Notes (Signed)
TIME SEEN: 9:30 PM  CHIEF COMPLAINT: Fever, chills, dysuria  HPI: Pt is a 69 y.o. male with history of hypertension who presents to the emergency department with fever, chills, nausea, dysuria. Had a prostate biopsy with Dr. Janice Norrie 2 days ago. After this he had some blood in his bowel movements but was feeling well. Reports today he noticed dysuria and hematuria and when he came home from work started having rigors and fever. Took ibuprofen at home. States he is feeling better. States he had nausea but no vomiting or diarrhea. No cough.  ROS: See HPI Constitutional:  fever  Eyes: no drainage  ENT: no runny nose   Cardiovascular:  no chest pain  Resp: no SOB  GI: no vomiting GU: no dysuria Integumentary: no rash  Allergy: no hives  Musculoskeletal: no leg swelling  Neurological: no slurred speech ROS otherwise negative  PAST MEDICAL HISTORY/PAST SURGICAL HISTORY:  Past Medical History  Diagnosis Date  . Hypertension   . 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 Authorizing Provider  amLODipine-valsartan (EXFORGE) 5-160 MG per tablet Take 1 tablet by mouth daily before breakfast.   Yes Historical Provider, MD  aspirin EC 81 MG tablet Take 81 mg by mouth daily before breakfast.   Yes Historical Provider, MD  B Complex Vitamins (B COMPLEX PO) Take 1 capsule by mouth daily.    Historical Provider, MD  Cholecalciferol (VITAMIN D PO) Take 250 Units by mouth 3 (three) times a week.    Yes Historical Provider, MD  ibuprofen (ADVIL,MOTRIN) 200 MG tablet Take 600 mg by mouth once.   Yes Historical Provider, MD  levofloxacin (LEVAQUIN) 500 MG tablet Take 1 tablet by mouth every morning. 12/28/14   Historical Provider, MD  LYSINE PO Take 1 tablet by mouth daily before breakfast.   Yes Historical Provider, MD  Multiple Vitamin (MULITIVITAMIN WITH MINERALS) TABS Take 1 tablet by mouth daily.     Historical Provider, MD    ALLERGIES:  No Known Allergies  SOCIAL HISTORY:  History  Substance Use Topics  . Smoking status: Never Smoker   . Smokeless tobacco: Never Used  . Alcohol Use: No    FAMILY HISTORY: No family history on file.  EXAM: BP 98/60 mmHg  Pulse 118  Temp(Src) 101.7 F (38.7 C) (Oral)  Resp 16  SpO2 95% CONSTITUTIONAL: Alert and oriented and responds appropriately to questions. Well-appearing; well-nourished, nontoxic-appearing, smiling and laughing the patient is mildly hypotensive HEAD: Normocephalic EYES: Conjunctivae clear, PERRL ENT: normal nose; no rhinorrhea; moist mucous membranes; pharynx without lesions noted NECK: Supple, no meningismus, no LAD  CARD: Regular and tachycardic; S1 and S2 appreciated; no murmurs, no clicks, no rubs, no gallops RESP: Normal chest excursion without splinting or tachypnea; breath sounds clear and equal bilaterally; no wheezes, no rhonchi, no rales, no hypoxia or respiratory distress ABD/GI: Normal bowel sounds; non-distended; soft, non-tender, no rebound, no guarding BACK:  The back appears normal and is non-tender to palpation, there is no CVA tenderness EXT: Normal ROM in all joints; non-tender to palpation; no edema; normal capillary refill; no cyanosis    SKIN: Normal color for age and race; warm, no rash NEURO: Moves all extremities equally PSYCH: The patient's mood and manner are appropriate. Grooming and personal hygiene are appropriate.  MEDICAL DECISION MAKING: Patient here with sepsis. Tachycardic, hypertensive, febrile. Started broad-spectrum device. We'll obtain labs, chest  x-ray, cultures, urine. Will give Tylenol for fever.  ED PROGRESS: Patient's lactate elevated. Also has elevated creatinine (last was 1 in 2013).  No leukocytosis. Continuing IV hydration. Chest x-ray clear.   Discussed with Dr. Wynetta Emery with urology service who agrees that he will admit patient. Will admit to step down. He will place  holding orders.    CRITICAL CARE Performed by: Nyra Jabs   Total critical care time: 45 minutes  Critical care time was exclusive of separately billable procedures and treating other patients.  Critical care was necessary to treat or prevent imminent or life-threatening deterioration.  Critical care was time spent personally by me on the following activities: development of treatment plan with patient and/or surrogate as well as nursing, discussions with consultants, evaluation of patient's response to treatment, examination of patient, obtaining history from patient or surrogate, ordering and performing treatments and interventions, ordering and review of laboratory studies, ordering and review of radiographic studies, pulse oximetry and re-evaluation of patient's condition.    EKG Interpretation  Date/Time:  Wednesday January 20 2015 21:29:36 EDT Ventricular Rate:  117 PR Interval:  154 QRS Duration: 84 QT Interval:  303 QTC Calculation: 423 R Axis:   58 Text Interpretation:  Sinus tachycardia Confirmed by Meher Kucinski,  DO, Georg Ang [id number]) on 01/20/2015 10:20:21 PM        [place], DO 01/20/15 2334

## 2015-01-20 NOTE — ED Notes (Signed)
RN and MD made aware of the elevated lactic acid result.

## 2015-01-20 NOTE — Progress Notes (Addendum)
ANTIBIOTIC CONSULT NOTE - INITIAL  Pharmacy Consult for vancomycin and zosyn Indication: rule out sepsis  No Known Allergies  Patient Measurements: Height: 6\' 2"  (188 cm) Weight: (!) 310 lb (140.615 kg) IBW/kg (Calculated) : 82.2  Vital Signs: Temp: 101.7 F (38.7 C) (04/27 2118) Temp Source: Oral (04/27 2118) BP: 98/60 mmHg (04/27 2118) Pulse Rate: 118 (04/27 2118) Intake/Output from previous day:   Intake/Output from this shift:    Labs:  Recent Labs  01/20/15 2140  WBC 6.0  HGB 11.9*  PLT 130*   CrCl cannot be calculated (Patient has no serum creatinine result on file.). No results for input(s): VANCOTROUGH, VANCOPEAK, VANCORANDOM, GENTTROUGH, GENTPEAK, GENTRANDOM, TOBRATROUGH, TOBRAPEAK, TOBRARND, AMIKACINPEAK, AMIKACINTROU, AMIKACIN in the last 72 hours.   Microbiology: No results found for this or any previous visit (from the past 720 hour(s)).  Medical History: Past Medical History  Diagnosis Date  . Hypertension   . 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 - LA 3.39 - scr 2.06 (crcl~35 N)  Goal of Therapy:  Vancomycin trough level 15-20 mcg/ml  Plan:  - vancomycin 1gm x1 in addition to 1gm ordered by MD to get 2 gm total, then 1500mg  IV q24h - zosyn 3.375gm IV x1 over 30 minutes, then 3.375 gm Iv q8h (over 4 hours) - f/u renal function and adjust dose if/when appropriate  Detria Cummings P 01/20/2015,9:55 PM

## 2015-01-21 DIAGNOSIS — R112 Nausea with vomiting, unspecified: Secondary | ICD-10-CM

## 2015-01-21 DIAGNOSIS — G4489 Other headache syndrome: Secondary | ICD-10-CM

## 2015-01-21 DIAGNOSIS — N179 Acute kidney failure, unspecified: Secondary | ICD-10-CM

## 2015-01-21 DIAGNOSIS — N10 Acute tubulo-interstitial nephritis: Secondary | ICD-10-CM

## 2015-01-21 LAB — BASIC METABOLIC PANEL
ANION GAP: 5 (ref 5–15)
BUN: 31 mg/dL — ABNORMAL HIGH (ref 6–23)
CALCIUM: 7.3 mg/dL — AB (ref 8.4–10.5)
CO2: 22 mmol/L (ref 19–32)
CREATININE: 1.96 mg/dL — AB (ref 0.50–1.35)
Chloride: 110 mmol/L (ref 96–112)
GFR calc Af Amer: 39 mL/min — ABNORMAL LOW (ref >90)
GFR calc non Af Amer: 34 mL/min — ABNORMAL LOW (ref >90)
Glucose, Bld: 154 mg/dL — ABNORMAL HIGH (ref 70–99)
Potassium: 3.8 mmol/L (ref 3.5–5.1)
SODIUM: 137 mmol/L (ref 135–145)

## 2015-01-21 LAB — CBC
HCT: 33.7 % — ABNORMAL LOW (ref 39.0–52.0)
HEMOGLOBIN: 10.5 g/dL — AB (ref 13.0–17.0)
MCH: 28.8 pg (ref 26.0–34.0)
MCHC: 31.2 g/dL (ref 30.0–36.0)
MCV: 92.3 fL (ref 78.0–100.0)
PLATELETS: 113 10*3/uL — AB (ref 150–400)
RBC: 3.65 MIL/uL — AB (ref 4.22–5.81)
RDW: 14.8 % (ref 11.5–15.5)
WBC: 10.6 10*3/uL — ABNORMAL HIGH (ref 4.0–10.5)

## 2015-01-21 LAB — PROCALCITONIN: PROCALCITONIN: 30.21 ng/mL

## 2015-01-21 LAB — LACTIC ACID, PLASMA: LACTIC ACID, VENOUS: 2.3 mmol/L — AB (ref 0.5–2.0)

## 2015-01-21 LAB — MRSA PCR SCREENING: MRSA by PCR: NEGATIVE

## 2015-01-21 MED ORDER — SODIUM CHLORIDE 0.9 % IV BOLUS (SEPSIS)
1000.0000 mL | Freq: Once | INTRAVENOUS | Status: AC
  Administered 2015-01-21: 1000 mL via INTRAVENOUS

## 2015-01-21 MED ORDER — ACETAMINOPHEN 325 MG PO TABS
650.0000 mg | ORAL_TABLET | ORAL | Status: DC | PRN
  Administered 2015-01-21 – 2015-01-22 (×3): 650 mg via ORAL
  Filled 2015-01-21 (×3): qty 2

## 2015-01-21 MED ORDER — SODIUM CHLORIDE 0.9 % IV SOLN: INTRAVENOUS | Status: DC | PRN

## 2015-01-21 MED ORDER — ACETAMINOPHEN 325 MG PO TABS: 650.0000 mg | ORAL_TABLET | ORAL | Status: DC | PRN

## 2015-01-21 MED ORDER — DEXTROSE IN LACTATED RINGERS 5 % IV SOLN: INTRAVENOUS | Status: DC

## 2015-01-21 MED ORDER — ONDANSETRON HCL 4 MG/2ML IJ SOLN
INTRAMUSCULAR | Status: AC
  Filled 2015-01-21: qty 2

## 2015-01-21 MED ORDER — PHENYLEPHRINE HCL 10 MG/ML IJ SOLN
0.0000 ug/min | INTRAVENOUS | Status: DC
  Administered 2015-01-21: 20 ug/min via INTRAVENOUS
  Filled 2015-01-21: qty 1

## 2015-01-21 MED ORDER — HYDROCODONE-ACETAMINOPHEN 5-325 MG PO TABS: 1.0000 | ORAL_TABLET | ORAL | Status: DC | PRN

## 2015-01-21 MED ORDER — ONDANSETRON HCL 4 MG/2ML IJ SOLN
4.0000 mg | Freq: Four times a day (QID) | INTRAMUSCULAR | Status: DC | PRN
  Administered 2015-01-21: 4 mg via INTRAVENOUS

## 2015-01-21 NOTE — Progress Notes (Signed)
Spoke with Critical Care MD on call. Patient remaining hypotensive after 4L IVF, though tachycardia improved. Agreed that he needs Critical Care attention and was made ICU status for refractory hypotension. He will need a central line and possibly pressors. Urology will continue to follow and appreciate management by Critical Care.

## 2015-01-21 NOTE — Consult Note (Addendum)
Name: [name] MRN: [id number] DOB: 1945-11-13    ADMISSION DATE:  01/20/2015 CONSULTATION DATE:  01/21/2015  REFERRING MD :  Macdiarmid  CHIEF COMPLAINT:  Hypotension  BRIEF PATIENT DESCRIPTION: 69 y.o. M brought to [facility] 4/27 with hypotension and tachycardia.  Had prostate biopsy two days prior and now with UTI.  PCCM consulted for persistent hypotension in early AM hours 4/28.  SIGNIFICANT EVENTS  4/25 - prostate biopsy (Dr. Janice Norrie) 4/27 - admitted with concern for sepsis 4/28 - PCCM consulted  STUDIES:  CXR 4/27 >>> no acute process   HISTORY OF PRESENT ILLNESS:  [name] is a 69 y.o. M with PMH of HTN who presented to [facility] ED 4/27 with fevers (TMax 101 F), chills, rigors, urinary frequency.  He had prostate biopsy performed on 4/25 for elevated PSA (Dr. Janice Norrie - Urology).  He reports that procedure was fine with no complications.  He had mild blood in his stool for a day or two following procedure which Dr. Janice Norrie told him was expected.  On day of ED presentation, he had blood in urine as well; however, Dr. Janice Norrie informed him that this was also expected following biopsy.  He did have urinary frequency and some burning with voiding which was new.  Denied any chest pain, SOB, cough, N/V/D, abd pain, myalgias.  In ED, he was found to be hypotensive  With SBP in 90's and tachycardic with HR around 120.  Lactate was elevated at 3.39.  He was admitted by urology to the SDU and treated with broad spectrum abx for sepsis (Vanc / Zosyn).  In early AM hours 4/28, pt remained hypotensive with SBP in 90's after 3L IVF.  PCCM was consulted for further recs. He has been voiding without any issues and denies any respiratory distress.  He states fevers, chills, and rigors have resolved since admission.  He is now comfortable on room air, CXR is clear.  MAP is 60 and pt mentating clearly.  UA noted to be positive for nitrites and leukocytes.  PAST MEDICAL HISTORY :   has a past medical history of  Hypertension and Arthritis.  has past surgical history that includes Cholecystectomy (2006); Joint replacement (2008); and Colonoscopy (N/A, 01/09/2013). Prior to Admission medications   Medication Sig Start Date End Date Taking? Authorizing Provider  amLODipine-valsartan (EXFORGE) 5-160 MG per tablet Take 1 tablet by mouth daily before breakfast.   Yes Historical Provider, MD  aspirin EC 81 MG tablet Take 81 mg by mouth daily before breakfast.   Yes Historical Provider, MD  B Complex Vitamins (B COMPLEX PO) Take 1 capsule by mouth daily.    Historical Provider, MD  Cholecalciferol (VITAMIN D PO) Take 250 Units by mouth 3 (three) times a week.    Yes Historical Provider, MD  ibuprofen (ADVIL,MOTRIN) 200 MG tablet Take 600 mg by mouth once.   Yes Historical Provider, MD  levofloxacin (LEVAQUIN) 500 MG tablet Take 1 tablet by mouth every morning. 12/28/14   Historical Provider, MD  LYSINE PO Take 1 tablet by mouth daily before breakfast.   Yes Historical Provider, MD  Multiple Vitamin (MULITIVITAMIN WITH MINERALS) TABS Take 1 tablet by mouth daily.    Historical Provider, MD   No Known Allergies  FAMILY HISTORY:  family history is not on file. SOCIAL HISTORY:  reports that he has never smoked. He has never used smokeless tobacco. He reports that he does not drink alcohol or use illicit drugs.  REVIEW OF SYSTEMS:  All negative; except for those that are bolded, which indicate positives.  Constitutional: weight loss, weight gain, night sweats, fevers, chills, fatigue, weakness.  HEENT: headaches, sore throat, sneezing, nasal congestion, post nasal drip, difficulty swallowing, tooth/dental problems, visual complaints, visual changes, ear aches. Neuro: difficulty with speech, weakness, numbness, ataxia. CV:  chest pain, orthopnea, PND, swelling in lower extremities, dizziness, palpitations, syncope.  Resp: cough, hemoptysis, dyspnea, wheezing. GI  heartburn, indigestion, abdominal pain, nausea,  vomiting, diarrhea, constipation, change in bowel habits, loss of appetite, hematemesis, melena, hematochezia.  GU: dysuria, change in color of urine, urgency or frequency, flank pain, hematuria. MSK: joint pain or swelling, decreased range of motion. Psych: change in mood or affect, depression, anxiety, suicidal ideations, homicidal ideations. Skin: rash, itching, bruising.   SUBJECTIVE: Denies fevers/chills/sweats, chest pain, SOB.  States "I feel pretty darn good right now".  VITAL SIGNS: Temp:  [98.1 F (36.7 C)-101.7 F (38.7 C)] 98.1 F (36.7 C) (04/27 2226) Pulse Rate:  [86-146] 87 (04/28 0200) Resp:  [16-34] 19 (04/28 0200) BP: (71-102)/(36-63) 95/52 mmHg (04/28 0200) SpO2:  [93 %-98 %] 95 % (04/28 0200) Weight:  [140.615 kg (310 lb)] 140.615 kg (310 lb) (04/27 2154)  PHYSICAL EXAMINATION: General: Pleasant adult male, WDWN, resting in bed visiting with wife, in NAD. Neuro: A&O x 3, non-focal.  HEENT: [place]/AT. PERRL, sclerae anicteric. Cardiovascular: RRR, no M/R/G.  Lungs: Respirations even and unlabored.  CTA bilaterally, No W/R/R. Abdomen: BS x 4, soft, NT/ND.  Musculoskeletal: No gross deformities, no edema.  Skin: Intact, warm, no rashes.    Recent Labs Lab 01/20/15 2140  NA 135  K 4.3  CL 105  CO2 23  BUN 33*  CREATININE 2.06*  GLUCOSE 121*    Recent Labs Lab 01/20/15 2140  HGB 11.9*  HCT 37.8*  WBC 6.0  PLT 130*   Dg Chest Port 1 View  01/20/2015   CLINICAL DATA:  69 year old male with a history of fever and chills.  EXAM: PORTABLE CHEST - 1 VIEW  COMPARISON:  10/10/2011, 12/17/2006  FINDINGS: Cardiomediastinal silhouette unchanged in size and contour.  Atherosclerotic calcification of the aortic arch.  Apical lordotic positioning.  No confluent airspace disease.  No pleural effusion or pneumothorax.  IMPRESSION: No radiographic evidence of acute cardiopulmonary disease.  Atherosclerosis.  Signed,  Dulcy Fanny. Earleen Newport, DO  [facility]  [facility]   Electronically Signed   By: Corrie Mckusick D.O.   On: 01/20/2015 22:05    ASSESSMENT / PLAN:  Urosepsis - and ? whether any complications or bacterial seeding / translocation following prostate biopsy from 4/25.  He is gradually responding to IVF resuscitation, MAP is now 63 after 4L of IVF's and he has normal mental status. Plan: Additional 1L NS bolus now. MIVF of NS @ 125. Will accept MAP > 55 given normal mental status. Will defer placement of CVL for now given that pt mentating clearly and is completely asymptomatic. If needed, can use neo-synephrine PRN through PIV.  If continues to need vasopressor support, then day team will place CVL. Continue broad spectrum abx for now. Follow cultures. Repeat lactate. PCT algorithm. CXR in AM.   Montey Hora, PA - [facility] Pager: [phone]  or [phone] 01/21/2015, 2:15 AM   Attending:  I have seen and examined the patient with nurse practitioner/resident and agree with the note above.   Mr. Hoben said he had hematuria just before  the onset of his chills and fever yesterday.  His prostate biopsy was on 4/25. After receiving antibiotics he feels much better.  However he had nausea and rigors this morning.  Exam: lungs clear, CV exam WNL, no rash, no abdominal tenderness or masses  CXR images reveiwed > no infiltrate, normal appearing lung fields  Severe sepsis> improved, lactic acid cleared, due to urinary source, prostatitis probable based on systemic response; would plan 14 days of antibiotics minimum after cultures back. Continue IVF UTI> as above, narrow antibiotics when culture result back, continue broad spectrum antibiotics for now Nausea/vomiting > new this morning, LFTs OK, belly exam OK, likely fever/sepsis related; plan to start zofran, monitor belly exam, continue diet Headache> new this morning, mild, APAP prn AKI> improved renal  function this morning, continue IVF, check BMET in am  We will follow  Roselie Awkward, MD [place] [facility] Pager: [phone] Cell: [phone] If no response, call [phone]

## 2015-01-21 NOTE — Progress Notes (Signed)
  Subjective: Remained hypotensive overnight with MAPs in 60s, so transferred to ICU and Critical Care service was consulted. Placed a-line and gave additional fluids. BP improved. Goal MAPs were > 55 as patient was mentating well.  Objective: Vital signs in last 24 hours: Temp:  [97.9 F (36.6 C)-101.7 F (38.7 C)] 97.9 F (36.6 C) (04/28 0319) Pulse Rate:  [71-146] 71 (04/28 0530) Resp:  [16-34] 25 (04/28 0530) BP: (68-102)/(31-63) 99/60 mmHg (04/28 0430) SpO2:  [93 %-98 %] 96 % (04/28 0530) Weight:  [310 lb (140.615 kg)] 310 lb (140.615 kg) (04/27 2154)  Intake/Output from previous day: 04/27 0701 - 04/28 0700 In: 614.6 [I.V.:564.6; IV Piggyback:50] Out: 775 [Urine:775] Intake/Output this shift: Total I/O In: 614.6 [I.V.:564.6; IV Piggyback:50] Out: 775 [Urine:775]  Physical Exam:  General: Alert and oriented CV: RRR Lungs: Clear Abdomen: Soft, ND Ext: NT, No erythema  Lab Results:  Recent Labs  01/20/15 2140 01/21/15 0350  HGB 11.9* 10.5*  HCT 37.8* 33.7*   BMET  Recent Labs  01/20/15 2140 01/21/15 0350  NA 135 137  K 4.3 3.8  CL 105 110  CO2 23 22  GLUCOSE 121* 154*  BUN 33* 31*  CREATININE 2.06* 1.96*  CALCIUM 8.6 7.3*   Urinalysis    Component Value Date/Time   COLORURINE RED* 01/20/2015 2235   APPEARANCEUR TURBID* 01/20/2015 2235   LABSPEC 1.025 01/20/2015 2235   PHURINE 5.5 01/20/2015 2235   GLUCOSEU NEGATIVE 01/20/2015 2235   HGBUR LARGE* 01/20/2015 2235   BILIRUBINUR LARGE* 01/20/2015 2235   KETONESUR 15* 01/20/2015 2235   PROTEINUR >300* 01/20/2015 2235   UROBILINOGEN 1.0 01/20/2015 2235   NITRITE POSITIVE* 01/20/2015 2235   LEUKOCYTESUR MODERATE* 01/20/2015 2235     Studies/Results: Dg Chest Port 1 View  01/20/2015   CLINICAL DATA:  69 year old male with a history of fever and chills.  EXAM: PORTABLE CHEST - 1 VIEW  COMPARISON:  10/10/2011, 12/17/2006  FINDINGS: Cardiomediastinal silhouette unchanged in size and contour.   Atherosclerotic calcification of the aortic arch.  Apical lordotic positioning.  No confluent airspace disease.  No pleural effusion or pneumothorax.  IMPRESSION: No radiographic evidence of acute cardiopulmonary disease.  Atherosclerosis.  Signed,  Dulcy Fanny. Earleen Newport, DO  [facility]  [facility]   Electronically Signed   By: Corrie Mckusick D.O.   On: 01/20/2015 22:05    Assessment/Plan: Post-prostate biopsy sepsis, Critical Care Medicine has been consulted. Holding off on central line and pressors for now.  -- Continue MIVF -- Goal MAPs > 55 -- Continue empiric vanc/zosyn. -- Follow up UCx and BCx    LOS: 1 day   Embrie Mikkelsen C 01/21/2015, 6:19 AM

## 2015-01-21 NOTE — Progress Notes (Signed)
I performed a history and physical examination of the patient and discussed his management with the resident.  I reviewed the resident's note and agree with the documented findings and plan of care    Patient feels much better today Dr Janice Norrie to see

## 2015-01-21 NOTE — Care Management Note (Signed)
  Page 1 of 1   01/21/2015     10:53:05 AM CARE MANAGEMENT NOTE 01/21/2015  Patient:  [name], [name]   Account Number:  [id number]  Date Initiated:  01/21/2015  Documentation initiated by:  Cambrea Kirt  Subjective/Objective Assessment:   sepsis uro source     Action/Plan:   home when stable   Anticipated DC Date:  01/24/2015   Anticipated DC Plan:  HOME/SELF CARE  In-house referral  NA      DC Planning Services  NA      [facility] Choice  NA   Choice offered to / List presented to:  NA      DME agency  NA        [place] agency  NA   Status of service:  In process, will continue to follow Medicare Important Message given?   (If response is "NO", the following Medicare IM given date fields will be blank) Date Medicare IM given:   Medicare IM given by:   Date Additional Medicare IM given:   Additional Medicare IM given by:    Discharge Disposition:    Per UR Regulation:  Reviewed for med. necessity/level of care/duration of stay  If discussed at [place] of Stay Meetings, dates discussed:    Comments:  January 21, 2015/Shiya Fogelman L. Rosana Hoes, RN, BSN, CCM. Case Management [place] [phone] No discharge needs present of time of review.

## 2015-01-21 NOTE — Progress Notes (Signed)
eLink Physician-Brief Progress Note Patient Name: [name] DOB: 06-27-1946 MRN: [id number]   Date of Service  01/21/2015  HPI/Events of Note  Hypotension. BP = 72/37. Has had 4 liters of crystalloid and remains hypotensive.   eICU Interventions  Will start on a Periferal Phenylephrine IV infusion until a central venous catheter can be placed and then would change to a Norepinephrine IV infusion.      Intervention Category Major Interventions: Hypotension - evaluation and management  Sommer,Steven Eugene 01/21/2015, 1:32 AM

## 2015-01-21 NOTE — Procedures (Signed)
Arterial Catheter Insertion Procedure Note EMILE KYLLO [id number] 01/31/1946  Procedure: Insertion of Arterial Catheter  Indications: Blood pressure monitoring  Procedure Details Consent: Risks of procedure as well as the alternatives and risks of each were explained to the (patient/caregiver).  Consent for procedure obtained. Time Out: Verified patient identification, verified procedure, site/side was marked, verified correct patient position, special equipment/implants available, medications/allergies/relevent history reviewed, required imaging and test results available.  Performed  Maximum sterile technique was used including antiseptics, cap, gloves, gown, hand hygiene, mask and sheet. Skin prep: Chlorhexidine; local anesthetic administered 20 gauge catheter was inserted into left radial artery using the Seldinger technique.  Evaluation Blood flow good; BP tracing good. Complications: No apparent complications.   Azekiel Cremer P 01/21/2015

## 2015-01-22 DIAGNOSIS — I10 Essential (primary) hypertension: Secondary | ICD-10-CM

## 2015-01-22 DIAGNOSIS — N410 Acute prostatitis: Secondary | ICD-10-CM

## 2015-01-22 LAB — PROCALCITONIN: Procalcitonin: 24.48 ng/mL

## 2015-01-22 LAB — BASIC METABOLIC PANEL
Anion gap: 7 (ref 5–15)
BUN: 19 mg/dL (ref 6–23)
CALCIUM: 7.6 mg/dL — AB (ref 8.4–10.5)
CO2: 23 mmol/L (ref 19–32)
Chloride: 107 mmol/L (ref 96–112)
Creatinine, Ser: 1.31 mg/dL (ref 0.50–1.35)
GFR, EST AFRICAN AMERICAN: 63 mL/min — AB (ref >90)
GFR, EST NON AFRICAN AMERICAN: 55 mL/min — AB (ref >90)
Glucose, Bld: 125 mg/dL — ABNORMAL HIGH (ref 70–99)
Potassium: 3.8 mmol/L (ref 3.5–5.1)
SODIUM: 137 mmol/L (ref 135–145)

## 2015-01-22 MED ORDER — SULFAMETHOXAZOLE-TRIMETHOPRIM 800-160 MG PO TABS: 1.0000 | ORAL_TABLET | Freq: Two times a day (BID) | ORAL | Status: DC

## 2015-01-22 MED ORDER — BACID PO TABS
2.0000 | ORAL_TABLET | Freq: Three times a day (TID) | ORAL | Status: DC
  Filled 2015-01-22 (×3): qty 2

## 2015-01-22 MED ORDER — LACTINEX PO CHEW
2.0000 | CHEWABLE_TABLET | Freq: Three times a day (TID) | ORAL | Status: DC
  Administered 2015-01-22: 2 via ORAL
  Filled 2015-01-22 (×3): qty 2

## 2015-01-22 MED ORDER — AMLODIPINE BESYLATE 5 MG PO TABS
5.0000 mg | ORAL_TABLET | Freq: Every day | ORAL | Status: DC
  Administered 2015-01-22: 5 mg via ORAL
  Filled 2015-01-22: qty 1

## 2015-01-22 NOTE — Discharge Summary (Signed)
Physician Discharge Summary  Patient ID: [name] MRN: [id number] DOB/AGE: 1945/10/08 69 y.o.  Admit date: 01/20/2015 Discharge date: 01/22/2015  Admission Diagnoses: Urosepsis.  S/P ultrasound prostate biopsy  Discharge Diagnoses: Same Active Problems:   Sepsis   Discharged Condition: Improved  Hospital Course: 69 years old male admitted on on 4/27 or chills, fever, tachycardia and hypotension.  Had ultrasound guided prostate biopsy on 4/25.  Was doing well until Wednesday afternoon when he started with chills, fever, feeling weak.  He called the on call physician who advised him to go to the ER. He was found to be hypotensive.  He was admitted and treated with IV Zosyn and Vancomycin.  Critical Care Medicine was consulted for persistent hypotension in spite of aggressive rehydration.  He was transferred to [facility]. He did not have any respiratory distress.  His blood pressure gradually went up without any other intervention.  He defervesced and remained afebrile.  He voided well.  His urine was grossly clear.  Urine culture was positive for E.coli.  Sensitivities were still pending at time of discharge.  But the E. Coli is probably resistant to fluoroquinolones.  He was discharged home on Septra D/S.  He will be followed at the office.  Consults: Critical Care Medicine  Significant Diagnostic Studies: Urine culture  Treatments: Zosyn and Vancomycin  Discharge Exam: Blood pressure 108/69, pulse 79, temperature 98.4 F (36.9 C), temperature source Oral, resp. rate 20, height 6\' 2"  (1.88 m), weight 140.615 kg (310 lb), SpO2 96 %. Abdomen: Soft, non tender.  Bladder not distended.  Disposition: 01-Home or Self Care     Medication List    TAKE these medications        amLODipine-valsartan 5-160 MG per tablet  Commonly known as:  EXFORGE  Take 1 tablet by mouth daily before breakfast.     aspirin EC 81 MG tablet  Take 81 mg by mouth daily before breakfast.     B  COMPLEX PO  Take 1 capsule by mouth daily.     ibuprofen 200 MG tablet  Commonly known as:  ADVIL,MOTRIN  Take 600 mg by mouth once.     levofloxacin 500 MG tablet  Commonly known as:  LEVAQUIN  Take 1 tablet by mouth every morning.     LYSINE PO  Take 1 tablet by mouth daily before breakfast.     multivitamin with minerals Tabs tablet  Take 1 tablet by mouth daily.     sulfamethoxazole-trimethoprim 800-160 MG per tablet  Commonly known as:  BACTRIM DS,SEPTRA DS  Take 1 tablet by mouth 2 (two) times daily.     VITAMIN D PO  Take 250 Units by mouth 3 (three) times a week.         Signed: Arvil Persons 01/22/2015, 6:27 PM

## 2015-01-22 NOTE — Progress Notes (Signed)
[facility]  S: Mr. Arakawa is feeling well this morning, no pain; had a bowel movement yesterday, urine clear; Past Medical History  Diagnosis Date  . Hypertension   . 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    O:  Filed Vitals:   01/22/15 0556 01/22/15 0600 01/22/15 0749 01/22/15 0800  BP: 123/66  123/78   Pulse: 75 72 73   Temp:    98.6 F (37 C)  TempSrc:    Oral  Resp: 19 18    Height:      Weight:      SpO2: 98% 99% 97%    Gen: well appearing HENT: OP clear, neck supple PULM: CTA B, normal effort CV: RRR, no mgr, trace edema GI: BS+, soft, nontender Derm: no cyanosis or rash Psyche: normal mood and affect  Urine culture: ecoli Blood culture: normal  No new images to review Urology records reviewed  Impression/rec: 1) Sepsis> due to urinary tract infection, given severity of illness favor prostatitis, now improved; culture with ecoli but awaiting sensitivity; I have d/c'd vancomycin; once sensitivity back need to change to oral agent (likely cipro) and would plan for 14 day course total; have added probiotic to help prevent c.diff  2) AKI> much improved with IVF  3) Hypertension>  restart amlodipine today, resume amlodipine-valsartan combination at hospital discharge  Will write transfer orders out of ICU, OK to d/c home from my perspective  PCCM to sign off, call if questions  Roselie Awkward, MD [place] [facility] Pager: [phone] Cell: [phone] If no response, call [phone]

## 2015-01-23 LAB — URINE CULTURE

## 2015-01-27 LAB — CULTURE, BLOOD (ROUTINE X 2)
Culture: NO GROWTH
Culture: NO GROWTH

## 2015-02-24 ENCOUNTER — Ambulatory Visit: Payer: Commercial Managed Care - HMO | Admitting: Radiation Oncology

## 2015-02-24 ENCOUNTER — Ambulatory Visit: Payer: Commercial Managed Care - HMO

## 2015-02-24 ENCOUNTER — Encounter: Payer: Self-pay | Admitting: Radiation Oncology

## 2015-02-24 NOTE — Progress Notes (Signed)
GU Location of Tumor / Histology: prostatic adenocarcinoma  If Prostate Cancer, Gleason Score is (3 + 3) and PSA is (6.36)  [name] presented 2 years ago with an elevated PSA. Referred to Dr. Janice Norrie by Dr. Josetta Huddle.  Biopsies of prostate (if applicable) revealed:    Past/Anticipated interventions by urology, if any: active surveillance, prostate biopsy, referral to radiation oncology  Past/Anticipated interventions by medical oncology, if any: no  Weight changes, if any: no  Bowel/Bladder complaints, if any: frequency, nocturia x3, erectile dysfunction   Nausea/Vomiting, if any: no  Pain issues, if any:  no  SAFETY ISSUES:  Prior radiation? no  Pacemaker/ICD? no  Possible current pregnancy? no  Is the patient on methotrexate? no  Current Complaints / other details:  69 year old male. Prostate volume: 54.42 cc. Interested in seeds.

## 2015-02-25 ENCOUNTER — Ambulatory Visit
Admission: RE | Admit: 2015-02-25 | Discharge: 2015-02-25 | Disposition: A | Discharge status: Home / Self Care | Payer: Commercial Managed Care - HMO | Source: Ambulatory Visit | Attending: Radiation Oncology | Admitting: Radiation Oncology

## 2015-02-25 ENCOUNTER — Ambulatory Visit: Payer: Commercial Managed Care - HMO

## 2015-02-25 ENCOUNTER — Ambulatory Visit: Payer: Commercial Managed Care - HMO | Admitting: Radiation Oncology

## 2015-02-25 ENCOUNTER — Telehealth: Payer: Self-pay | Admitting: Radiation Oncology

## 2015-02-25 ENCOUNTER — Encounter: Payer: Self-pay | Admitting: Radiation Oncology

## 2015-02-25 VITALS — BP 101/64 | HR 97 | Resp 16 | Ht 74.0 in | Wt 320.0 lb

## 2015-02-25 DIAGNOSIS — C61 Malignant neoplasm of prostate: Secondary | ICD-10-CM | POA: Diagnosis present

## 2015-02-25 DIAGNOSIS — I10 Essential (primary) hypertension: Secondary | ICD-10-CM | POA: Insufficient documentation

## 2015-02-25 DIAGNOSIS — Z510 Encounter for antineoplastic radiation therapy: Secondary | ICD-10-CM | POA: Insufficient documentation

## 2015-02-25 DIAGNOSIS — Z7982 Long term (current) use of aspirin: Secondary | ICD-10-CM | POA: Insufficient documentation

## 2015-02-25 NOTE — Telephone Encounter (Signed)
-----   Message from Guerry Minors sent at 02/24/2015  3:47 PM EDT ----- Regarding: CANCELLED APPOINTMENT Hi, Mr. Suder did not get the message his appointment was moved to Thursday.  He came today.  Got angry and left.  Notified [facility].  Thanks, Santiago Glad

## 2015-02-25 NOTE — Progress Notes (Signed)
See progress note under physician encounter. 

## 2015-02-25 NOTE — Telephone Encounter (Signed)
Phoned patient apologizing for confusion. Encouraged patient to present today at 1430 for consult. He accepted. Appt placed back on the physician's schedule for today.

## 2015-02-25 NOTE — Progress Notes (Signed)
Radiation Oncology         [phone]) [phone] ________________________________  Initial outpatient Consultation  Name: [name] MRN: [id number]  Date: 02/25/2015  DOB: 09/21/1946  UY:[id number] NEVILL, MD  Lowella Bandy, MD   REFERRING PHYSICIAN: Lowella Bandy, MD  DIAGNOSIS: 69 y.o. gentleman with stage T1c adenocarcinoma of the prostate with a Gleason's score of 3+3 and a PSA of 6.36    ICD-9-CM ICD-10-CM   1. Malignant neoplasm of prostate Levasy Roy::[name] is a 70 y.o. gentleman.  He was noted to have an elevated PSA of 5.20 on 10/23/14 by his primary care physician, Dr. Marcellus Scott .  Accordingly, he was referred for evaluation in urology by Dr. Janice Norrie on 11/19/14,  digital rectal examination was performed at that time revealing 2+ gland with no nodularity.  The patient proceeded to transrectal ultrasound with 12 biopsies of the prostate on 01/18/15.  The prostate volume measured 54.42 cc.  Out of 12 core biopsies, 2 were positive.  The maximum Gleason score was 3+3, and this was seen in biopsy.     The patient reviewed the biopsy results with his urologist and he has kindly been referred today for discussion of potential radiation treatment options.  PREVIOUS RADIATION THERAPY: No  PAST MEDICAL HISTORY:  has a past medical history of Hypertension; Arthritis; and Prostate cancer.    PAST SURGICAL HISTORY: Past Surgical History  Procedure Laterality Date  . Cholecystectomy  2006  . Joint replacement  2008    right hip arthroplasty  . Colonoscopy N/A 01/09/2013    Procedure: COLONOSCOPY;  Surgeon: Garlan Fair, MD;  Location: [facility];  Service: Endoscopy;  Laterality: N/A;  . Prostate biopsy      FAMILY HISTORY: family history includes Cancer in his mother; Diabetes in his brother; Kidney Stones in his brother; Stroke in his brother.  SOCIAL HISTORY:  reports that he has never smoked. He has never used smokeless tobacco. He reports that  he does not drink alcohol or use illicit drugs.  ALLERGIES: Review of patient's allergies indicates no known allergies.  MEDICATIONS:  Current Outpatient Prescriptions  Medication Sig Dispense Refill  . amLODipine-valsartan (EXFORGE) 5-160 MG per tablet Take 1 tablet by mouth daily before breakfast.    . aspirin EC 81 MG tablet Take 81 mg by mouth daily before breakfast.    . Cholecalciferol (VITAMIN D PO) Take 250 Units by mouth 3 (three) times a week.     Marland Kitchen ibuprofen (ADVIL,MOTRIN) 200 MG tablet Take 600 mg by mouth once.    Marland Kitchen LYSINE PO Take 1 tablet by mouth daily before breakfast.    . tamsulosin (FLOMAX) 0.4 MG CAPS capsule     . B Complex Vitamins (B COMPLEX PO) Take 1 capsule by mouth daily.    . Multiple Vitamin (MULITIVITAMIN WITH MINERALS) TABS Take 1 tablet by mouth daily.     No current facility-administered medications for this encounter.    REVIEW OF SYSTEMS:  A 15 point review of systems is documented in the electronic medical record. This was obtained by the nursing staff. However, I reviewed this with the patient to discuss relevant findings and make appropriate changes.  Pertinent items are noted in HPI..  The patient completed an IPSS and IIEF questionnaire.  His IPSS score was 11 indicating moderate urinary outflow obstructive symptoms.  He indicated that his erectile function is not typically able to complete sexual activity. Pt has had  some dysuria over the last 4 days.   PHYSICAL EXAM: This patient is in no acute distress.  He is alert and oriented.   height is 6\' 2"  (1.88 m) and weight is 320 lb (145.151 kg). His blood pressure is 101/64 and his pulse is 97. His respiration is 16 and oxygen saturation is 98%.  He exhibits no respiratory distress or labored breathing.  He appears neurologically intact.  His mood is pleasant.  His affect is appropriate.  Please note the digital rectal exam findings described above.  KPS = 100  100 - Normal; no complaints; no evidence of  disease. 90   - Able to carry on normal activity; minor signs or symptoms of disease. 80   - Normal activity with effort; some signs or symptoms of disease. 66   - Cares for self; unable to carry on normal activity or to do active work. 60   - Requires occasional assistance, but is able to care for most of his personal needs. 50   - Requires considerable assistance and frequent medical care. 59   - Disabled; requires special care and assistance. 2   - Severely disabled; hospital admission is indicated although death not imminent. 84   - Very sick; hospital admission necessary; active supportive treatment necessary. 10   - Moribund; fatal processes progressing rapidly. 0     - Dead  Karnofsky DA, [place], Craver LS and Burchenal [facility] [phone]) The use of the nitrogen mustards in the palliative treatment of carcinoma: with particular reference to bronchogenic carcinoma Cancer 1 634-56   LABORATORY DATA:  Lab Results  Component Value Date   WBC 10.6* 01/21/2015   HGB 10.5* 01/21/2015   HCT 33.7* 01/21/2015   MCV 92.3 01/21/2015   PLT 113* 01/21/2015   Lab Results  Component Value Date   NA 137 01/22/2015   K 3.8 01/22/2015   CL 107 01/22/2015   CO2 23 01/22/2015   Lab Results  Component Value Date   ALT 18 01/20/2015   AST 32 01/20/2015   ALKPHOS 57 01/20/2015   BILITOT 1.2 01/20/2015     RADIOGRAPHY: No results found.    IMPRESSION: This gentleman is a 69 y.o. gentleman with stage T1c adenocarcinoma of the prostate with a Gleason's score of 3+3 and a PSA of 6.36.  His T-Stage, Gleason's Score, and PSA put him into the favorable risk group.  Accordingly he is eligible for a variety of potential treatment options including prostatectomy, IMRT, seed implant or active surveillance   PLAN:Today I reviewed the findings and workup thus far. We discussed the natural history of prostate cancer.  We reviewed the the implications of T-stage, Gleason's Score, and PSA on decision-making  and outcomes in prostate cancer.  We discussed radiation treatment in the management of prostate cancer with regard to the logistics and delivery of external beam radiation treatment as well as the logistics and delivery of prostate brachytherapy.  We compared and contrasted each of these approaches and also compared these against prostatectomy.  The patient expressed interest in prostate brachytherapy.  I filled out a patient counseling form for him with relevant treatment diagrams and we retained a copy for our records.   The patient would like to proceed with prostate brachytherapy.  I will share my findings with Dr. Janice Norrie and move forward with scheduling the procedure in the near future.     I enjoyed meeting with him today, and will look forward to participating in the care of  this very nice gentleman.  I spent 60 minutes face to face with the patient and more than 50% of that time was spent in counseling and/or coordination of care.   This document serves as a record of services personally performed by Tyler Pita, MD. It was created on his behalf by Pearlie Oyster, a trained medical scribe. The creation of this record is based on the scribe's personal observations and the provider's statements to them. This document has been checked and approved by the attending provider.      ------------------------------------------------  Sheral Apley Tammi Klippel, M.D.

## 2015-02-25 NOTE — Progress Notes (Signed)
In addition to frequency, nocturia x3, and erectile dysfunction he reports new onset dysuria.

## 2015-02-25 NOTE — Progress Notes (Signed)
  PCP Marcellus Scott  11/19/14  PSA = 6.36  01/18/15   TRUS  54.42 cc 2/12 positive

## 2015-02-26 ENCOUNTER — Telehealth: Payer: Self-pay | Admitting: *Deleted

## 2015-02-26 NOTE — Telephone Encounter (Signed)
Called patient to inform of pre-seed appt. On 03-12-15 @ 2 pm, I also informed him that the urologist's office and I am looking for a date for his implant, right now we aren't finding anything that works, the urologist's office is looking and they will call me with a date, patient verified understanding this.

## 2015-03-08 ENCOUNTER — Telehealth: Payer: Self-pay | Admitting: Radiation Oncology

## 2015-03-08 ENCOUNTER — Other Ambulatory Visit: Payer: Self-pay | Admitting: Urology

## 2015-03-08 NOTE — Telephone Encounter (Signed)
-----   Message from Tyler Pita, MD sent at 03/08/2015 10:36 AM EDT ----- Regarding: RE: [place] bound This is a prostate rad-onc in [place] with an excellent reputation  Thereasa Parkin, M.D. [place] Phone: [phone] [place] [address]  ----- Message -----    From: Heywood Footman, RN    Sent: 03/08/2015  10:22 AM      To: Tyler Pita, MD, Kerri Perches Subject: [place] bound                                  Dr. Tammi Klippel.  Enid Derry phoned this patient on Friday to discuss the particulars of his implant. The patient requested this be cancelled because his house has sold and they are moving to [place].   Sam

## 2015-03-08 NOTE — Telephone Encounter (Signed)
Phoned patient as directed by Dr. Tammi Klippel. Informed the patient of Dr. Damaris Schooner. Explained he comes highly recommended. Provided the patient with this physician's contact information. Patient expressed great appreciation for the call and information.

## 2015-03-12 ENCOUNTER — Ambulatory Visit: Payer: Commercial Managed Care - HMO | Admitting: Radiation Oncology

## 2015-03-12 ENCOUNTER — Ambulatory Visit
Admission: RE | Admit: 2015-03-12 | Discharge: 2015-03-12 | Disposition: A | Discharge status: Home / Self Care | Payer: Commercial Managed Care - HMO | Source: Ambulatory Visit | Attending: Radiation Oncology | Admitting: Radiation Oncology

## 2015-05-21 ENCOUNTER — Encounter (HOSPITAL_BASED_OUTPATIENT_CLINIC_OR_DEPARTMENT_OTHER): Admission: RE | Payer: Self-pay | Source: Ambulatory Visit

## 2015-05-21 ENCOUNTER — Ambulatory Visit (HOSPITAL_BASED_OUTPATIENT_CLINIC_OR_DEPARTMENT_OTHER): Admission: RE | Admit: 2015-05-21 | Payer: Commercial Managed Care - HMO | Source: Ambulatory Visit | Admitting: Urology

## 2015-05-21 SURGERY — INSERTION, RADIATION SOURCE, PROSTATE
Anesthesia: Choice

## 2017-04-21 IMAGING — CR DG CHEST 1V PORT
1 series · 1 of 1 positions shown · non-contrast
Comparison: 10/10/2011, 12/17/2006

CLINICAL DATA: 69-year-old male with a history of fever and chills.

EXAM:
PORTABLE CHEST - 1 VIEW

[AP]
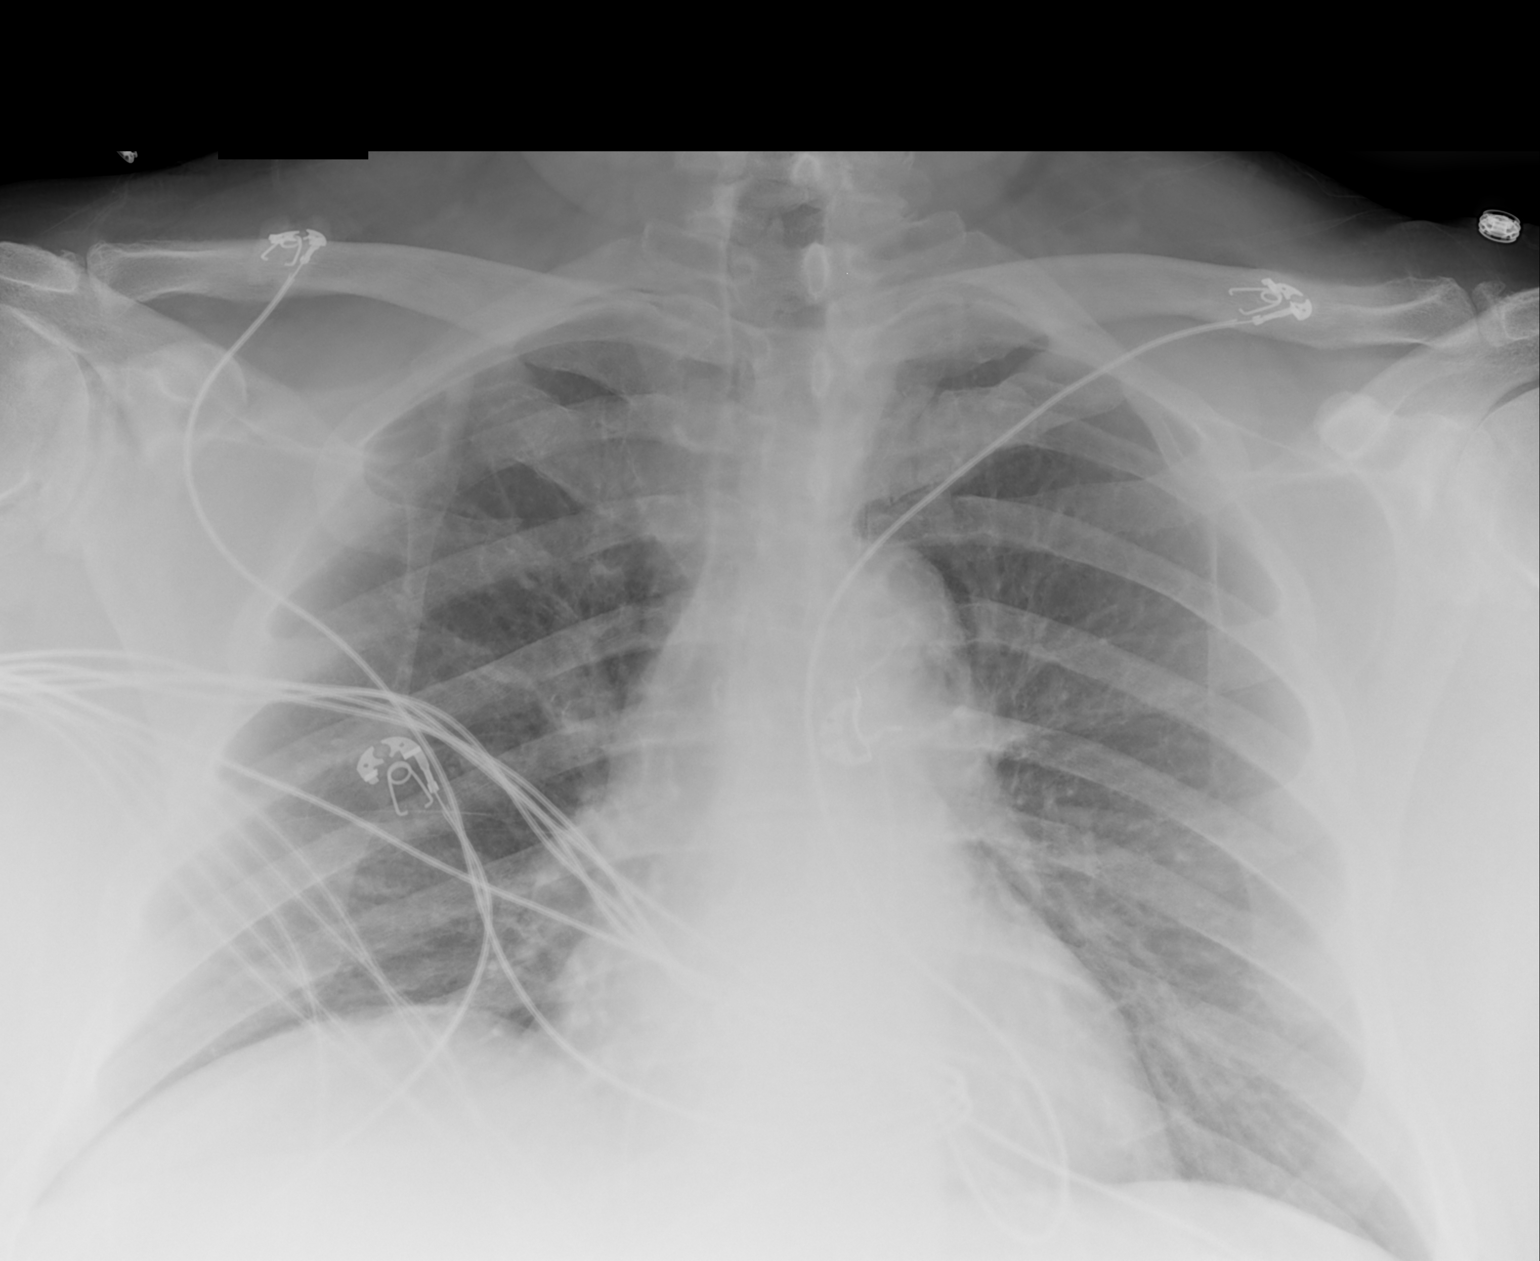

[1 of 1 positions shown; findings below may reference images not displayed]

FINDINGS: Cardiomediastinal silhouette unchanged in size and contour.

Atherosclerotic calcification of the aortic arch.

Apical lordotic positioning.

No confluent airspace disease.  No pleural effusion or pneumothorax.
IMPRESSION: No radiographic evidence of acute cardiopulmonary disease.

Atherosclerosis.
